# Patient Record
Sex: Female | Born: 1950
Health system: Southern US, Community
[De-identification: ages and names within clinical notes are randomized; demographics above are authoritative.]

## PROBLEM LIST (undated history)

## (undated) DIAGNOSIS — M797 Fibromyalgia: Secondary | ICD-10-CM

## (undated) DIAGNOSIS — E785 Hyperlipidemia, unspecified: Secondary | ICD-10-CM

## (undated) DIAGNOSIS — E049 Nontoxic goiter, unspecified: Secondary | ICD-10-CM

## (undated) DIAGNOSIS — G4733 Obstructive sleep apnea (adult) (pediatric): Secondary | ICD-10-CM

## (undated) DIAGNOSIS — T7840XA Allergy, unspecified, initial encounter: Secondary | ICD-10-CM

## (undated) DIAGNOSIS — Z9989 Dependence on other enabling machines and devices: Secondary | ICD-10-CM

## (undated) DIAGNOSIS — J302 Other seasonal allergic rhinitis: Secondary | ICD-10-CM

## (undated) DIAGNOSIS — K5900 Constipation, unspecified: Secondary | ICD-10-CM

## (undated) DIAGNOSIS — E559 Vitamin D deficiency, unspecified: Secondary | ICD-10-CM

## (undated) HISTORY — DX: Fibromyalgia: M79.7

## (undated) HISTORY — PX: VAGINAL HYSTERECTOMY: SUR661

## (undated) HISTORY — PX: TONSILLECTOMY: SUR1361

## (undated) HISTORY — DX: Hyperlipidemia, unspecified: E78.5

## (undated) HISTORY — PX: CARPAL TUNNEL RELEASE: SHX101

## (undated) HISTORY — PX: TUBAL LIGATION: SHX77

## (undated) HISTORY — DX: Morbid (severe) obesity due to excess calories: E66.01

## (undated) HISTORY — PX: OTHER SURGICAL HISTORY: SHX169

## (undated) HISTORY — PX: CHOLECYSTECTOMY OPEN: SUR202

## (undated) HISTORY — PX: COLONOSCOPY W/ POLYPECTOMY: SHX1380

## (undated) HISTORY — DX: Allergy, unspecified, initial encounter: T78.40XA

## (undated) HISTORY — DX: Vitamin D deficiency, unspecified: E55.9

---

## 1998-06-04 ENCOUNTER — Other Ambulatory Visit: Admission: RE | Admit: 1998-06-04 | Discharge: 1998-06-04 | Payer: Self-pay | Admitting: *Deleted

## 1998-09-12 ENCOUNTER — Ambulatory Visit (HOSPITAL_COMMUNITY): Admission: RE | Admit: 1998-09-12 | Discharge: 1998-09-12 | Payer: Self-pay | Admitting: Internal Medicine

## 2000-06-30 ENCOUNTER — Encounter (INDEPENDENT_AMBULATORY_CARE_PROVIDER_SITE_OTHER): Payer: Self-pay | Admitting: Specialist

## 2000-06-30 ENCOUNTER — Ambulatory Visit (HOSPITAL_COMMUNITY): Admission: RE | Admit: 2000-06-30 | Discharge: 2000-06-30 | Payer: Self-pay | Admitting: Gastroenterology

## 2000-07-06 ENCOUNTER — Encounter: Admission: RE | Admit: 2000-07-06 | Discharge: 2000-07-06 | Payer: Self-pay | Admitting: *Deleted

## 2000-07-15 ENCOUNTER — Encounter: Admission: RE | Admit: 2000-07-15 | Discharge: 2000-07-15 | Payer: Self-pay | Admitting: *Deleted

## 2001-05-31 ENCOUNTER — Encounter: Admission: RE | Admit: 2001-05-31 | Discharge: 2001-05-31 | Payer: Self-pay | Admitting: *Deleted

## 2001-09-20 ENCOUNTER — Encounter: Payer: Self-pay | Admitting: Internal Medicine

## 2001-09-20 ENCOUNTER — Encounter: Admission: RE | Admit: 2001-09-20 | Discharge: 2001-09-20 | Payer: Self-pay | Admitting: Internal Medicine

## 2001-10-26 ENCOUNTER — Other Ambulatory Visit: Admission: RE | Admit: 2001-10-26 | Discharge: 2001-10-26 | Payer: Self-pay | Admitting: *Deleted

## 2001-12-25 ENCOUNTER — Ambulatory Visit (HOSPITAL_COMMUNITY): Admission: RE | Admit: 2001-12-25 | Discharge: 2001-12-25 | Payer: Self-pay | Admitting: Gastroenterology

## 2001-12-25 ENCOUNTER — Encounter: Payer: Self-pay | Admitting: Gastroenterology

## 2001-12-29 ENCOUNTER — Encounter: Payer: Self-pay | Admitting: Gastroenterology

## 2001-12-29 ENCOUNTER — Ambulatory Visit (HOSPITAL_COMMUNITY): Admission: RE | Admit: 2001-12-29 | Discharge: 2001-12-29 | Payer: Self-pay | Admitting: Gastroenterology

## 2002-01-16 ENCOUNTER — Ambulatory Visit (HOSPITAL_BASED_OUTPATIENT_CLINIC_OR_DEPARTMENT_OTHER): Admission: RE | Admit: 2002-01-16 | Discharge: 2002-01-16 | Payer: Self-pay | Admitting: Orthopedic Surgery

## 2002-01-24 ENCOUNTER — Ambulatory Visit (HOSPITAL_COMMUNITY): Admission: RE | Admit: 2002-01-24 | Discharge: 2002-01-24 | Payer: Self-pay | Admitting: Gastroenterology

## 2002-01-24 ENCOUNTER — Encounter (INDEPENDENT_AMBULATORY_CARE_PROVIDER_SITE_OTHER): Payer: Self-pay | Admitting: Specialist

## 2002-02-23 ENCOUNTER — Encounter: Payer: Self-pay | Admitting: Surgery

## 2002-02-23 ENCOUNTER — Encounter (INDEPENDENT_AMBULATORY_CARE_PROVIDER_SITE_OTHER): Payer: Self-pay | Admitting: Specialist

## 2002-02-23 ENCOUNTER — Observation Stay (HOSPITAL_COMMUNITY): Admission: RE | Admit: 2002-02-23 | Discharge: 2002-02-24 | Payer: Self-pay | Admitting: Surgery

## 2002-07-01 ENCOUNTER — Encounter: Payer: Self-pay | Admitting: Emergency Medicine

## 2002-07-01 ENCOUNTER — Emergency Department (HOSPITAL_COMMUNITY): Admission: EM | Admit: 2002-07-01 | Discharge: 2002-07-01 | Payer: Self-pay | Admitting: Emergency Medicine

## 2002-07-05 ENCOUNTER — Inpatient Hospital Stay (HOSPITAL_COMMUNITY): Admission: AD | Admit: 2002-07-05 | Discharge: 2002-07-10 | Payer: Self-pay | Admitting: Internal Medicine

## 2002-07-06 ENCOUNTER — Encounter: Payer: Self-pay | Admitting: *Deleted

## 2002-11-19 ENCOUNTER — Ambulatory Visit (HOSPITAL_COMMUNITY): Admission: RE | Admit: 2002-11-19 | Discharge: 2002-11-19 | Payer: Self-pay | Admitting: Internal Medicine

## 2003-04-11 ENCOUNTER — Encounter: Payer: Self-pay | Admitting: Internal Medicine

## 2003-04-11 ENCOUNTER — Encounter: Admission: RE | Admit: 2003-04-11 | Discharge: 2003-04-11 | Payer: Self-pay | Admitting: Internal Medicine

## 2003-04-17 ENCOUNTER — Encounter: Admission: RE | Admit: 2003-04-17 | Discharge: 2003-04-17 | Payer: Self-pay | Admitting: *Deleted

## 2003-04-25 ENCOUNTER — Encounter: Admission: RE | Admit: 2003-04-25 | Discharge: 2003-05-24 | Payer: Self-pay | Admitting: Internal Medicine

## 2004-11-17 ENCOUNTER — Emergency Department (HOSPITAL_COMMUNITY): Admission: EM | Admit: 2004-11-17 | Discharge: 2004-11-17 | Payer: Self-pay | Admitting: Emergency Medicine

## 2004-12-14 ENCOUNTER — Encounter (INDEPENDENT_AMBULATORY_CARE_PROVIDER_SITE_OTHER): Payer: Self-pay | Admitting: *Deleted

## 2004-12-14 ENCOUNTER — Ambulatory Visit (HOSPITAL_COMMUNITY): Admission: RE | Admit: 2004-12-14 | Discharge: 2004-12-14 | Payer: Self-pay | Admitting: Gastroenterology

## 2005-04-08 ENCOUNTER — Encounter: Admission: RE | Admit: 2005-04-08 | Discharge: 2005-04-08 | Payer: Self-pay | Admitting: Internal Medicine

## 2005-11-19 ENCOUNTER — Encounter: Admission: RE | Admit: 2005-11-19 | Discharge: 2005-11-19 | Payer: Self-pay | Admitting: Internal Medicine

## 2006-03-16 ENCOUNTER — Encounter: Payer: Self-pay | Admitting: Internal Medicine

## 2006-04-21 ENCOUNTER — Ambulatory Visit (HOSPITAL_COMMUNITY): Admission: RE | Admit: 2006-04-21 | Discharge: 2006-04-21 | Payer: Self-pay | Admitting: Internal Medicine

## 2006-04-21 ENCOUNTER — Encounter: Payer: Self-pay | Admitting: Vascular Surgery

## 2007-01-25 ENCOUNTER — Encounter: Admission: RE | Admit: 2007-01-25 | Discharge: 2007-01-25 | Payer: Self-pay | Admitting: Internal Medicine

## 2008-02-28 ENCOUNTER — Encounter: Admission: RE | Admit: 2008-02-28 | Discharge: 2008-02-28 | Payer: Self-pay | Admitting: Internal Medicine

## 2009-03-04 ENCOUNTER — Encounter: Admission: RE | Admit: 2009-03-04 | Discharge: 2009-03-04 | Payer: Self-pay | Admitting: Internal Medicine

## 2009-03-11 ENCOUNTER — Encounter: Admission: RE | Admit: 2009-03-11 | Discharge: 2009-03-11 | Payer: Self-pay | Admitting: Internal Medicine

## 2010-03-16 ENCOUNTER — Encounter: Admission: RE | Admit: 2010-03-16 | Discharge: 2010-03-16 | Payer: Self-pay | Admitting: Internal Medicine

## 2011-04-02 NOTE — Procedures (Signed)
[place]. [facility]  Patient:    Rachel Hess, Rachel Hess                    MRN: [id number] Proc. Date: 06/30/00 Adm. Date:  [id number] Attending:  Charna Seraphim CC:         Lind Guest. August Saucer, M.D.                           Procedure Report  DATE OF BIRTH:  Oct 10, 1951  REFERRING PHYSICIAN:  Lind Guest. August Saucer, M.D.  PROCEDURE PERFORMED:  Colonoscopy with hot biopsies x 3.  ENDOSCOPIST:  Anselmo Rod, M.D.  INSTRUMENT USED:  Olympus video colonoscope.  INDICATIONS FOR PROCEDURE:  Blood in stool in a 60 year old black female. Rule out colonic polyps, masses, hemorrhoids, etc.  The patient also has a long-standing history of constipation.  PREPROCEDURE PREPARATION:  Informed consent was obtained from the patient. The patient was fasted for eight hours prior to the procedure, and prepped with a bottle of magnesium citrate and a gallon of Nulytely the night prior to the procedure.  PREPROCEDURE PHYSICAL EXAMINATION:  VITAL SIGNS:  Stable.  NECK:  Supple.  CHEST:  Clear to auscultation.  S1 and S2 regular.  ABDOMEN:  Soft with normal abdominal bowel sounds.  DESCRIPTION OF PROCEDURE:  The patient was placed in the left lateral decubitus position and sedated with 50 mg of Demerol and 7 mg of Versed intravenously.  Once the patient was adequately sedated and maintained on low flow oxygen and continuous cardiac monitoring, the Olympus video colonoscope was advanced from the rectum to the cecum without difficulty.  Except for left-sided diverticulosis and three diminutive polyps that were removed for hot biopsies from the left colon.  No other masses or polyps were seen.  Small internal hemorrhoids appreciated on retroflexion in the rectum.  The rest of the colon appeared normal up to the cecum.  IMPRESSION: 1. Small non-bleeding internal hemorrhoid. 2. Left-sided diverticulosis. 3. Three small diminutive polyps removed by hot biopsy from left colon. 4.  Normal-appearing transverse colon, right colon, and cecum.  RECOMMENDATIONS: 1. Await pathology results. 2. Increase fluid and fiber in the diet. 3. Outpatient follow up in the next two weeks. DD:  06/30/00 TD:  06/30/00 Job: [id number]

## 2011-04-02 NOTE — Op Note (Signed)
NAME:  Rachel Hess, Rachel Hess           ACCOUNT NO.:  [id number]   MEDICAL RECORD NO.:  [id number]          PATIENT TYPE:  AMB   LOCATION:  ENDO                         FACILITY:  [facility]   PHYSICIAN:  Anselmo Rod, M.D.  DATE OF BIRTH:  May 11, 1951   DATE OF PROCEDURE:  12/14/2004  DATE OF DISCHARGE:                                 OPERATIVE REPORT   PROCEDURE:  Colonoscopy with snare polypectomies x2 and cold biopsies x2.   ENDOSCOPIST:  Anselmo Rod, M.D.   INSTRUMENT USED:  Olympus video colonoscope.   INDICATION FOR PROCEDURE:  A 60 year old African-American female who  underwent screening colonoscopy to rule out colonic polyps, masses, etc.   PREPROCEDURE PREPARATION:  Informed consent was procured from the patient  and the patient fasted for 8 hours prior to the procedure and prepped with a  bottle of magnesium citrate and a gallon of GoLYTELY the night prior to the  procedure.   PREPROCEDURE PHYSICAL:  The patient had stable vital signs, neck supple,  chest clear to auscultation, S1/S2 regular, abdomen soft with normal bowel  sounds.   DESCRIPTION OF PROCEDURE:  The patient was placed in the left lateral  decubitus position and sedated with 100 mg of Demerol and 10 mg of Versed in  slow incremental doses.  Once the patient was adequately sedated and  maintained on low-flow oxygen and continuous cardiac monitoring, the Olympus  video colonoscope was advanced from the rectum to the cecum.  The patient  had some residual stool in the colon and multiple washes were done.  Two  small sessile polyps were biopsied in the rectosigmoid colon and another  sessile polyp was snared from the anal verge.  Another small sessile polyp  was ablated from the rectum with the tip of the snare.  There was evidence  of pandiverticulosis with large diverticula throughout the colon.  The  appendiceal orifice and the ileocecal valve were clearly visualized and  photographed.  The patient  tolerated the procedure well without immediate  complications.  Retroflexion in the rectum revealed small internal  hemorrhoids.   IMPRESSION:  1.  Small, nonbleeding internal hemorrhoids.  2.  Pandiverticulosis with large diverticula throughout the colon.  3.  One large polyp snared from the rectum, another polyp ablated in the      rectum with the tip of the snare.  4.  Two small sessile polyps biopsied from the rectosigmoid colon.  5.  Normal-appearing cecum.   RECOMMENDATIONS:  1.  Await pathology results.  2.  Avoid all nonsteroidals, including aspirin for the next 4 weeks.  3.  Outpatient followup in the next 2 weeks or earlier if need be.      JNM/MEDQ  D:  12/14/2004  T:  12/14/2004  Job:  [id number]   cc:   Minerva Areola L. August Saucer, M.D.  [address] [address]  [place]  [place] [address]  Fax: [phone]

## 2011-04-02 NOTE — Discharge Summary (Signed)
NAME:  Rachel Hess, Rachel Hess                     ACCOUNT NO.:  [id number]   MEDICAL RECORD NO.:  [id number]                   PATIENT TYPE:  INP   LOCATION:  3036                                 FACILITY:  [facility]   PHYSICIAN:  Eric L. August Saucer, M.D.                  DATE OF BIRTH:  07/05/1951   DATE OF ADMISSION:  07/05/2002  DATE OF DISCHARGE:  07/10/2002                                 DISCHARGE SUMMARY   FINAL DIAGNOSES:  1. Unspecified viral infection (079.99).  2. Meningitis secondary to viral infection (322.9).  3. Mastoiditis (383.9).   PROCEDURE:  Spinal tap per Doneta Public, M.D.   HISTORY OF PRESENT ILLNESS:  This is the first recent [facility]  admission for this 60 year old single black female who was recently seen in  the office for evaluation of persistent headache. The headaches first  started approximately eight days prior to presentation. They were dull and  right sided in nature. He has transient nausea without vomiting. She  initially went to the [facility]. She was treated with pain  medication without significant relief. The patient subsequent went to [facility] where a CT scan of the head was done. No lab work was  obtained. She was given Hydrocodone with APAP. The symptoms persisted  thereafter with increasing tenderness along the right mastoid region. She  was seen in the office for evaluation of these symptoms and subsequently  given Zithromax as well as Ultracet for control of the pain. Over the  subsequent days, however, symptoms did not improve. She subsequent presented  to the office with nausea and persistent headaches of unclear etiology. She  was admitted thereafter for further evaluation and control of her symptoms.   PAST MEDICAL HISTORY:  As per admission history and physical.   HOSPITAL COURSE:  The patient was admitted for further evaluation of  persistent right sided headaches. The question of possible viral versus  bacterial meningitis was entertained. Possibility of an atypical migraine  could not be excluded. She was placed at bedrest initially. Started on IV  fluids. She was given Dilaudid with Phenergan parenterally for control of  the pain. Lab data was obtained thereafter to exclude infectious causes. She  was seen in consultation by Dr. Noreene Filbert of Neurology as well. It is felt  that her symptoms are highly suspicious for a meningitis bacterial versus  viral. She subsequently underwent LP under fluoroscopy as she was a  difficult tap. The fluid was initially clear and slightly blood tinged  thereafter. Prior studies were negative for cryptococcal antigen. VDRL was  negative. Sed rate was mildly elevated with CRP elevated at 8.4 as well. An  acute hepatitis panel was negative. The patient was maintained on supportive  care thereafter. Over subsequent days, she gradually made progress. The  question of a possible lupus was entertained as well. ANA was obtained which  was subsequently found to  be negative. The patient's picture gradually  improved steadily. There was a question of a mastoiditis which was treated  with antibiotics as well. After several days of therapy, she felt  significantly better. By July 09, 2002, the patient was ambulatory with no  significant headaches. She did complain of a stuffy cloudy sensation in her  head. No nausea and vomiting. She was subsequently felt to be stable for  discharge. Note that the viral studies were positive for a CMV infection,  though it was not consistent with an acute process.   DISPOSITION:  She was subsequently discharged to home on July 10, 2002  feeling considerably better.   DISCHARGE MEDICATIONS:  1. Zithromax 250 mg QD for an additional three days.  2. Allegra 180 mg QD.  3. Tylox one every four hours as needed pain.   ACTIVITY:  She is to increase her activity as tolerated.   DIET:  She will be maintained on a low sodium no  concentrated sweets diet.   FOLLOW UP:  In the office in two weeks time. She will need a repeat sed rate  and CRP to assure this is approaching back to normal.                                               Eric L. August Saucer, M.D.    ELD/MEDQ  D:  08/29/2002  T:  08/31/2002  Job:  [id number]

## 2011-04-02 NOTE — Procedures (Signed)
[name]. [facility]  Patient:    Rachel Hess, Rachel Hess Visit Number: [id number] MRN: [id number]          Service Type: END Location: ENDO Attending Physician:  Charna Syrita Dictated by:   Anselmo Rod, M.D. Proc. Date: 01/24/02 Admit Date:  01/24/2002   CC:         Minerva Areola L. August Saucer, M.D.  Abigail Miyamoto, M.D.   Procedure Report  DATE OF BIRTH:  [birth year]  REFERRING PHYSICIAN:  Lind Guest. August Saucer, M.D.  PROCEDURE PERFORMED:  Esophagogastroduodenoscopy with biopsies.  ENDOSCOPIST:  Anselmo Rod, M.D.  INSTRUMENT USED:  Olympus video panendoscope.  INDICATIONS FOR PROCEDURE:  Epigastric pain with occasional blood in stool in a 60 year old African-American female.  Patient also has known gallbladder polyps which are to be evaluated by Dr. Magnus Ivan next week.  PREPROCEDURE PREPARATION:  Informed consent was procured from the patient. The patient was fasted for eight hours prior to the procedure.  PREPROCEDURE PHYSICAL:  The patient had stable vital signs.  Neck supple. Chest clear to auscultation.  S1, S2 regular.  Abdomen soft with normal bowel sounds.  DESCRIPTION OF PROCEDURE:  The patient was placed in left lateral decubitus position and sedated with 60 mg of Demerol and 5 mg of Versed intravenously. Once the patient was adequately sedated and maintained on low-flow oxygen and continuous cardiac monitoring, the Olympus video panendoscope was advanced through the mouthpiece, over the tongue, into the esophagus under direct vision.  The entire esophagus appeared normal and without lesions.  The scope was then advanced to the stomach.  There was antral erythema noted.  No frank ulcers, erosions, masses or polyps were seen.  There was moderate duodenitis seen in the duodenal bulb.  Antral biopsies were done to rule out presence of Helicobacter pylori by pathology.  The proximal small bowel distal to the bulb appeared  normal.  IMPRESSION: 1. Normal-appearing esophagus. 2. Antral gastritis. 3. Moderate duodenitis in the duodenal bulb. 4. Normal-appearing small bowel distal to the bulb.  RECOMMENDATION: 1. Continue Nexium. 2. Treat with antibiotics if Helicobacter pylori present on biopsy. 3. Proceed with laparoscopic cholecystectomy as discussed with Dr. Abigail Miyamoto for gallbladder polyps. 4. Outpatient follow-up in the next two weeks. Dictated by:   Anselmo Rod, M.D. Attending Physician:  Charna Alisha DD:  01/24/02 TD:  01/25/02 Job: [id number]

## 2011-04-02 NOTE — Op Note (Signed)
[facility]  Patient:    Rachel Hess, Rachel Hess Visit Number: [id number] MRN: [id number]          Service Type: END Location: ENDO Attending Physician:  Charna Casia Dictated by:   Abigail Miyamoto, M.D. Proc. Date: 02/23/02 Admit Date:  01/24/2002 Discharge Date: 01/24/2002                             Operative Report  PREOPERATIVE DIAGNOSIS:  Gallbladder polyps.  POSTOPERATIVE DIAGNOSIS:  Gallbladder polyps, possible chronic cholecystitis.  OPERATION PERFORMED:  SURGEON:  Abigail Miyamoto, M.D.  ASSISTANT:  Donnie Coffin. Samuella Cota, M.D.  ANESTHESIA:  General endotracheal anesthesia and 0.25% Marcaine plain.  ESTIMATED BLOOD LOSS:  Minimal.  INDICATIONS FOR PROCEDURE:  The patient is a 60 year old female who presents with right upper quadrant pain, nausea and vomiting that is intermittent.  She has had ultrasound, HIDA scan and upper endoscopy.  The ultrasound showed polyps in the gallbladder.  HIDA scan showed normal ejection fraction. Given the patients symptoms and findings of gallbladder polyps, decision has been made to proceed to the operating room for cholecystectomy.  OPERATIVE FINDINGS:  The patient was found to have a chronically scarred appearing gallbladder.  No evidence of stones was identified.  DESCRIPTION OF PROCEDURE:  Patient brought to operating room and identified as [facility] [employer].  She was placed supine on the operating table and general anesthesia was induced.  Her abdomen was then prepped and draped in the usual sterile fashion.  Using a #15 blade a small transverse incision was made below the umbilicus.  The incision was carried down to the fascia which was then opened with a scalpel.  A hemostat was used to pass into the peritoneal cavity.  A 0 Vicryl pursestring suture was then placed around the fascial opening.  The Hasson port was placed through the opening and insufflation of the abdomen was begun.  An 11 mm port was  placed in the patients epigastrium and two 5 mm ports were placed in the patients right flank under direct vision.  The gallbladder was then identified and retracted above the liver bed.  Dissection was then carried out in the base of the gallbladder.  The cystic duct was dissected out after several adhesions were taken down.  The duct was clipped three times proximally, once distally and transected with scissors.  The cystic artery and a branch were identified and clipped proximally and distally and transected as well.  The gallbladder was then slowly dissected free from the liver bed with electrocautery.  Hemostasis was achieved in the liver bed with the cautery.  Once the gallbladder was incised from the liver bed it was removed through the incision at the umbilicus.  The 0 Vicryl at the umbilicus was tied in placed closing the fascial defect. Again, the liver bed was examined and hemostasis appeared to be achieved.  The abdomen was then irrigated with normal saline.  All ports were then removed under direct vision and the abdomen was deflated.  All incisions were then anesthetized with 0.25% Marcaine and then closed with 4-0 Monocryl sutures. Steri-Strips, gauze and tape were then applied.  The patient tolerated the procedure well.  All, sponge, needle and instrument counts were correct at the end of the procedure.  The patient was then extubated in the operating room and taken in stable condition to the recovery room. Dictated by:   Abigail Miyamoto, M.D. Attending Physician:  Charna Paytan DD:  02/23/02 TD:  02/23/02 Job: [id number]

## 2011-04-02 NOTE — Op Note (Signed)
[place]. [facility]  Patient:    Rachel Hess, Rachel Hess Visit Number: [id number] MRN: [id number]          Service Type: DSU Location: [facility] Attending Physician:  Susa Day Dictated by:   Katy Fitch Naaman Plummer., M.D. Proc. Date: 01/16/02 Admit Date:  01/16/2002                             Operative Report  PREOPERATIVE DIAGNOSIS:  Enlarging painful mass, volar aspect of left wrist consistent with flexor carpi radialis sheath ganglion.  POSTOPERATIVE DIAGNOSIS:  Enlarging painful mass, volar aspect of left wrist consistent with flexor carpi radialis sheath ganglion.  Identification of multilobular  ganglion, associated with flexor carpi radialis tendon sheath and the superficial radial artery branch to the palm.  OPERATION PERFORMED:  Excision of a multilobular ganglion from the flexor carpi radialis sheath, left volar wrist.  SURGEON:  Katy Fitch. Sypher, Montez Hageman., M.D.  ASSISTANT:  Jonni Sanger, P.A.  ANESTHESIA:  IV regional.  ANESTHESIOLOGIST:  Dr. Michelle Piper.  INDICATIONS FOR PROCEDURE:  The patient is a 60 year old woman who has had an enlarging mass over the volar aspect of her left wrist.  This is consistent with a ganglion adjacent to the flexor carpi radialis tendon sheath.  She had no antecedent history of injury.  She was experiencing discomfort and requested excision.  DESCRIPTION OF PROCEDURE:  Rachel Hess was brought to the operating room and placed in supine position on the operating table.  Following placement of an IV regional block at the proximal brachial level, the arm was prepped with Betadine soap and solution and sterilely draped.  When anesthesia was satisfactory, the procedure commenced with a short transverse incision in the distal wrist flexion crease.  The subcutaneous tissues were carefully divided taking care to gently dissect the mass circumferentially.  This was noted to be a multilobular ganglion and  appeared to be exiting from the ulnar aspect of the flexor carpi radialis sheath deep to the thenar muscles.  This was circumferentially dissected drained of its contents and carefully probed looking for extensions.  This appeared to extend down to the scaphotrapezial trapezoid joint, deep to the flexor carpi radialis.  This was followed to the joint and joint capsule was curetted with a House microcuret.  The extensions along the superficial branch of the radial artery and accompanying veins was likewise dissected and removed.  Bleeding points were electrocauterized by bipolar current.  I could not identify any clear difficulties along the flexor carpi radialis tendon sheath.  From experience we understand the ganglions in this location are prone to recurrence.  We have advised Ms. Leone preoperatively that we do not have an y way of guaranteeing resolution despite thorough curettage; however, most of the time these recur less than 10% of the time.  The wound was irrigated and repaired with intradermal 3-0 Prolene and Steri-Strips.  Compressive dressing was applied with a volar plaster splint to maintain the wrist in 5 degrees dorsiflexion. Dictated by:   Katy Fitch Naaman Plummer., M.D. Attending Physician:  Susa Day DD:  01/16/02 TD:  01/16/02 Job: [id number]

## 2011-04-02 NOTE — H&P (Signed)
NAME:  Rachel Hess, Rachel Hess                     ACCOUNT NO.:  [id number]   MEDICAL RECORD NO.:  [id number]                   PATIENT TYPE:  INP   LOCATION:  3036                                 FACILITY:  [facility]   PHYSICIAN:  Eric L. August Saucer, M.D.                  DATE OF BIRTH:  Jul 15, 1951   DATE OF ADMISSION:  07/05/2002  DATE OF DISCHARGE:                                HISTORY & PHYSICAL   CHIEF COMPLAINT:  Persistent headaches with progressive weakness.   HISTORY OF PRESENT ILLNESS:  This is the first recent [facility]  admission for this 60 year old single black female who recently was seen in  the office for evaluation of persistent headaches.  Headaches first started  at approximately at age 47.  Headaches were dull and right-sided in nature.  She had transient nausea without vomiting.  The patient initially went to  Urgent Care.  She was treated with pain medication without significant  relief.  She subsequently went to [facility] where a CT scan of her  head was done.  No laboratory work was obtained, however.  The patient was  given hydrocodone with APAP.  Her symptoms persisted, and she subsequently  was seen in the office for evaluation.  She had nonspecific findings at that  time except for mild tenderness in the right mastoid region, as well as some  small posterior cervical nodes.  Sinuses were otherwise clear, ears were  clear.  Question of possible mastoiditis versus sinusitis was entertained.  She was given Zithromax as well as Ultracet for control of pain.  Over the  subsequent days, her symptoms had not improved.  She noted mild nausea as  well for which she received Phenergan suppositories.  Despite this, her  symptoms had not improved, and she was subsequently admitted for further  evaluation.  Notably, she had intermittent low-grade fevers, no night  sweats.  She had not been exposed to anyone especially ill, however, she has  two grandparents in  nursing homes which she visits frequently.   The patient does not smoke or drink.  She had not had similar headaches in  the past.   REVIEW OF SYMPTOMS:  As noted above.  She had noticed some intermittent pain  on the right side of her neck with some mild discomfort from flexion.  Denies significant trouble with concentration.  Appetite had been decreased.   PAST MEDICAL HISTORY:  1. Cholecystectomy in 01/2002, per Dr. Lurene Shadow.  2. Hysterectomy in the past.  3. History of gangrenous cyst removal per Dr. Teressa Senter.   ALLERGIES:  No known drug allergies.   MEDICATIONS:  1. Z-Pack.  2. Ultracet one q.4h. p.r.n. pain.  3. Vivelle patch twice a week.   PHYSICAL EXAMINATION:  GENERAL:  She is a well-developed, well-nourished,  overweight black female, weak appearing.  VITAL SIGNS:  Blood pressure of 109/60, pulse of 76, respiratory rate 18,  temperature 98.2.  Height 5 feet 6 inches, weight 220 pounds.  O2 saturation  96% on room air.  HEENT:  Head is normocephalic.  There is no sinus tenderness.  Fundi disks  were flat.  Nose:  Mild turbinate edema bilaterally without occlusions.  Tympanic membranes are clear.  NECK:  She has tenderness in the right posterior cervical region.  Mild  discomfort on flexion.  No enlarged thyroid.  LUNGS:  Clear without wheezes or rales.  No E to A changes.  CARDIOVASCULAR:  Normal S1 and S2, no S3, S4, murmurs, or rubs.  ABDOMEN:  Bowel sounds are present, no enlargement of spleen, masses, or  tenderness.  EXTREMITIES:  Negative Homan's, no edema.  Full range of motion.  SKIN:  Without active lesions.  No rashes appreciated.  NEUROLOGIC:  Alert and oriented x3, though somewhat sluggish.  Cranial  nerves were intact.  Negative Kernig or Babinski.   LABORATORY DATA:  CBC reveals a white blood cell count of 7800, hemoglobin  of 12.9, hematocrit of 39.2 (this in contrast to a white count on 07/02/02 of  11,300 with a left shift).  Chemistries:  Sodium 141,  potassium 3.8,  chloride 103, CO2 31, BUN 9, creatinine 0.7, glucose 86.  Albumin 2.9.  SGOT  19, SGPT 59, alkaline phosphatase 141.  Other lab studies pending at this  time.  Of note, recent lab work on 04/01/2002, remarkable for a  sedimentation rate of 75.  Elevated liver function tests as well.   IMPRESSION:  1. Persistent right-sided headaches, question etiology.  Rule out viral     versus bacterial meningitis.  Rule out atypical migraine.  2. History of transiently elevated liver function tests, rule out secondary     to recent viral infection.  The patient does not smoke or drink.  3. Low albumin, rule out secondary to recent decreased p.o. intake versus     other.  She has a mild total protein albumin split which would suggest     some inflammatory hemopathy.   PLAN:  Consultation with neurology has been obtained for further evaluation  and possible spinal tap.  We will continue analgesia with Dilaudid 1 mg and  Phenergan 12.5 mg IV.  Further evaluation in response to above.  IV fluids  with rest at this time.                                               Eric L. August Saucer, M.D.    ELD/MEDQ  D:  07/05/2002  T:  07/08/2002  Job:  [id number]

## 2011-04-07 ENCOUNTER — Other Ambulatory Visit: Payer: Self-pay | Admitting: Internal Medicine

## 2011-04-07 DIAGNOSIS — Z1231 Encounter for screening mammogram for malignant neoplasm of breast: Secondary | ICD-10-CM

## 2011-04-30 ENCOUNTER — Ambulatory Visit
Admission: RE | Admit: 2011-04-30 | Discharge: 2011-04-30 | Disposition: A | Discharge status: Home / Self Care | Payer: 59 | Source: Ambulatory Visit | Attending: Internal Medicine | Admitting: Internal Medicine

## 2011-04-30 DIAGNOSIS — Z1231 Encounter for screening mammogram for malignant neoplasm of breast: Secondary | ICD-10-CM

## 2013-02-22 ENCOUNTER — Other Ambulatory Visit: Payer: Self-pay

## 2013-02-22 DIAGNOSIS — Z1231 Encounter for screening mammogram for malignant neoplasm of breast: Secondary | ICD-10-CM

## 2013-03-27 ENCOUNTER — Ambulatory Visit: Payer: 59

## 2013-04-02 ENCOUNTER — Ambulatory Visit
Admission: RE | Admit: 2013-04-02 | Discharge: 2013-04-02 | Disposition: A | Discharge status: Home / Self Care | Payer: 59 | Source: Ambulatory Visit

## 2013-04-02 DIAGNOSIS — Z1231 Encounter for screening mammogram for malignant neoplasm of breast: Secondary | ICD-10-CM

## 2013-10-19 ENCOUNTER — Telehealth: Payer: Self-pay | Admitting: *Deleted

## 2013-10-19 NOTE — Telephone Encounter (Signed)
Pt states the hard core Dr Irving Shows cut out in the summer has grown back. I explained that type of lesion often grew back and with routine maintenance could often be kept comfortable.  I offered pt and appt, but she said she would think about it.

## 2013-12-05 ENCOUNTER — Ambulatory Visit (INDEPENDENT_AMBULATORY_CARE_PROVIDER_SITE_OTHER): Payer: 59 | Admitting: Family Medicine

## 2013-12-05 ENCOUNTER — Encounter: Payer: Self-pay | Admitting: Family Medicine

## 2013-12-05 VITALS — BP 115/79 | HR 88 | Temp 98.4°F | Resp 18 | Ht 65.0 in | Wt 244.0 lb

## 2013-12-05 DIAGNOSIS — N9489 Other specified conditions associated with female genital organs and menstrual cycle: Secondary | ICD-10-CM

## 2013-12-05 DIAGNOSIS — R609 Edema, unspecified: Secondary | ICD-10-CM

## 2013-12-05 DIAGNOSIS — R600 Localized edema: Secondary | ICD-10-CM | POA: Insufficient documentation

## 2013-12-05 DIAGNOSIS — R5381 Other malaise: Secondary | ICD-10-CM

## 2013-12-05 DIAGNOSIS — N898 Other specified noninflammatory disorders of vagina: Secondary | ICD-10-CM | POA: Insufficient documentation

## 2013-12-05 DIAGNOSIS — R635 Abnormal weight gain: Secondary | ICD-10-CM

## 2013-12-05 DIAGNOSIS — R221 Localized swelling, mass and lump, neck: Secondary | ICD-10-CM

## 2013-12-05 DIAGNOSIS — K5909 Other constipation: Secondary | ICD-10-CM | POA: Insufficient documentation

## 2013-12-05 DIAGNOSIS — K5900 Constipation, unspecified: Secondary | ICD-10-CM

## 2013-12-05 DIAGNOSIS — R220 Localized swelling, mass and lump, head: Secondary | ICD-10-CM

## 2013-12-05 DIAGNOSIS — R5383 Other fatigue: Secondary | ICD-10-CM

## 2013-12-05 NOTE — Progress Notes (Signed)
Subjective:    Patient ID: Rachel Hess, female    DOB: 23-Mar-1951, 63 y.o.   MRN: [id number]  Constipation This is a chronic problem. The current episode started in the past 7 days. The problem has been gradually improving since onset. Her stool frequency is 2 to 3 times per week. Pertinent negatives include no back pain or fever.    Patient is in office to establish care with a primary provider. Reports that she was a patient of Dr. Bryon Lions, but was seen primarily by the nurse practitioner. Patient was last seen in October 2014 and had labs drawn at that times.   Patient states that she has ongoing constipation for a number of years and was sent to Dr. Collene Mares in 2013 for a screening colonoscopy. There were no abnormal findings per patient and she was told to return for a repeat colonoscopy in 5 years. Patient was given samples of Linzess, with minimal relief that "stopped working" and currently has 2-3 bowel movements per week.  Reports frequent lower extremity edema. Patient was started on Hydrochlorothiazide 25 mg daily for condition. Patient states that swelling has not improved.  Patient reports that she has gained "a considerable amount of weight over the last few years". States that she has not been motivated to start a diet and exercise regimen due to the fact that she "feels tired all the time".   Review of Systems  Constitutional: Positive for appetite change (increased), fatigue and unexpected weight change (weight gain). Negative for fever.  HENT: Negative.  Negative for congestion, facial swelling and postnasal drip.   Respiratory: Negative for cough, chest tightness and wheezing.   Cardiovascular: Negative for palpitations.  Gastrointestinal: Positive for constipation. Negative for abdominal distention.  Endocrine: Negative for polydipsia, polyphagia and polyuria.  Genitourinary: Negative for urgency, flank pain, vaginal bleeding and vaginal discharge.       Reports  vaginal dryness   Musculoskeletal: Negative for back pain.  Allergic/Immunologic: Negative.   Neurological: Negative.  Negative for dizziness, tremors and headaches.  Hematological: Negative.        Objective:   Physical Exam  Constitutional: She is oriented to person, place, and time. She appears well-developed and well-nourished.  HENT:  Head: Normocephalic.  Eyes: Pupils are equal, round, and reactive to light.  Neck: Normal range of motion. No tracheal tenderness present. Thyromegaly (fullness to neck on inspection and palpation) present. No mass present.  Cardiovascular: Normal rate and regular rhythm.   Pulmonary/Chest: Effort normal and breath sounds normal. No respiratory distress. She has no wheezes. She has no rales. She exhibits no tenderness.  Abdominal: Soft. Bowel sounds are normal. There is tenderness in the left upper quadrant.  Neurological: She is alert and oriented to person, place, and time. No cranial nerve deficit. Coordination abnormal.  Skin: Skin is warm, dry and intact.  Psychiatric: She has a normal mood and affect.       Assessment & Plan:  1. Chronic Constipation: Patient reports taking  Miralax for 2 weeks, with desired effects. Patient states that she had to take more than the recommended dose to achieve the desired effect. Recommend starting a high fiber diet for patient and increase water intake to 4-5 glasses per day. Start walking 3 times per week for 15 minutes. 2. 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 States that she currently has no desire to start a diet or exercise routine despite consistent weight gain.  Patient states that she currently eats 2 meals per day. Recommended that patient divide reduced calorie, high fiber diet over 6 small meals and walk for 15 minutes 3 times per week. Will obtain TSH and HgbA1c in  2 weeks.  3. Fullness to neck: Patient to RTC in 2 weeks for TSH 4.  Vaginal dryness: Patient states that she had an estrogen ring "years ago" post hysterectomy. Patient states that ring was discontinued after developing a "vaginal itch". Patient states that she has occasional vaginal dryness with sexual intercourse. Recommend OTC lubricant with sexual intercourse.  4. Lower extremity edema: Patient to discontinue Hydrochlorothiazide 25 mg daily and Ibuprofen 800 mg as needed. Recommend compression hose and elevate extremities to heart level while at rest. Patient expressed understanding.  5. Colonoscopy: In 2013, patient had a colonoscopy with Dr. Collene Mares that was within normal limits per patient. gave patient 5 years before next colonoscopy. Will review notes as they become available. 6.  Last Mammogram May, 19 2014. Reviewed results from [facility].  Will repeat mammogram in May 2015 7. Complete Hysterectomy at age 89. Last pap smear at age 18 was normal. Will perform pelvic examination at preventative visit in 1 month. 8. Hx of fibromyalgia per patient. Is currently not taking any medications for condition. States that she was going to a fibromyalgia specialist, however, stopped going for personal reasons. Patient cannot remember name and location of specialist.  9. Vaccinations: Patient states that she "does not take the flu shot". TDap was in 2012.  10. Exercise-Currently not exercising. Recommend 15 minutes low impact aerobic activity 3 times per week. 11. Last complete physical examination: Patient states that it could have been 1 year ago, but she does not recall. Schedule complete physical examination in 1 month  RTC: 1 month with Dr. Zigmund Daniel for preventative visit Labs: TSH (2 weeks)

## 2013-12-05 NOTE — Patient Instructions (Signed)
Stop taking Ibuprofen 800 mg as needed for pain. Recommend OTC Tylenol 650 mg every 6 hours as needed for mild to moderate pain.  Recommend Compression stockings daily for lower extremity edema. Elevate lower extremities to heart level while at rest Patient to return in 2 weeks for labs.

## 2013-12-19 ENCOUNTER — Other Ambulatory Visit: Payer: 59

## 2013-12-19 DIAGNOSIS — R635 Abnormal weight gain: Secondary | ICD-10-CM

## 2013-12-19 DIAGNOSIS — R600 Localized edema: Secondary | ICD-10-CM

## 2013-12-19 DIAGNOSIS — R5381 Other malaise: Secondary | ICD-10-CM

## 2013-12-19 DIAGNOSIS — R5383 Other fatigue: Secondary | ICD-10-CM

## 2013-12-19 LAB — CBC WITH DIFFERENTIAL/PLATELET
BASOS PCT: 1 % (ref 0–1)
Basophils Absolute: 0.1 10*3/uL (ref 0.0–0.1)
EOS ABS: 0.1 10*3/uL (ref 0.0–0.7)
EOS PCT: 2 % (ref 0–5)
HEMATOCRIT: 40.3 % (ref 36.0–46.0)
HEMOGLOBIN: 13.9 g/dL (ref 12.0–15.0)
LYMPHS PCT: 36 % (ref 12–46)
Lymphs Abs: 2.8 10*3/uL (ref 0.7–4.0)
MCH: 30 pg (ref 26.0–34.0)
MCHC: 34.5 g/dL (ref 30.0–36.0)
MCV: 87 fL (ref 78.0–100.0)
Monocytes Absolute: 0.5 10*3/uL (ref 0.1–1.0)
Monocytes Relative: 7 % (ref 3–12)
Neutro Abs: 4.4 10*3/uL (ref 1.7–7.7)
Neutrophils Relative %: 54 % (ref 43–77)
PLATELETS: 302 10*3/uL (ref 150–400)
RBC: 4.63 MIL/uL (ref 3.87–5.11)
RDW: 14.4 % (ref 11.5–15.5)
WBC: 7.8 10*3/uL (ref 4.0–10.5)

## 2013-12-19 LAB — COMPLETE METABOLIC PANEL WITH GFR
ALBUMIN: 3.8 g/dL (ref 3.5–5.2)
ALK PHOS: 85 U/L (ref 39–117)
ALT: 11 U/L (ref 0–35)
AST: 11 U/L (ref 0–37)
BILIRUBIN TOTAL: 0.4 mg/dL (ref 0.2–1.2)
BUN: 15 mg/dL (ref 6–23)
CALCIUM: 9.8 mg/dL (ref 8.4–10.5)
CHLORIDE: 103 meq/L (ref 96–112)
CO2: 27 meq/L (ref 19–32)
CREATININE: 0.76 mg/dL (ref 0.50–1.10)
GFR, EST NON AFRICAN AMERICAN: 83 mL/min
GFR, Est African American: >89 mL/min
Glucose, Bld: 111 mg/dL — ABNORMAL HIGH (ref 70–99)
POTASSIUM: 4.2 meq/L (ref 3.5–5.3)
SODIUM: 139 meq/L (ref 135–145)
Total Protein: 6.7 g/dL (ref 6.0–8.3)

## 2013-12-19 LAB — HEMOGLOBIN A1C
HEMOGLOBIN A1C: 6.6 % — AB (ref <5.7)
Mean Plasma Glucose: 143 mg/dL — ABNORMAL HIGH (ref <117)

## 2014-01-08 ENCOUNTER — Ambulatory Visit: Payer: 59 | Admitting: Family Medicine

## 2014-01-14 ENCOUNTER — Encounter: Payer: Self-pay | Admitting: Family Medicine

## 2014-01-14 ENCOUNTER — Ambulatory Visit (INDEPENDENT_AMBULATORY_CARE_PROVIDER_SITE_OTHER): Payer: 59 | Admitting: Family Medicine

## 2014-01-14 VITALS — BP 145/79 | HR 89 | Temp 97.0°F | Resp 20 | Ht 66.0 in | Wt 245.0 lb

## 2014-01-14 DIAGNOSIS — R635 Abnormal weight gain: Secondary | ICD-10-CM

## 2014-01-14 DIAGNOSIS — R7309 Other abnormal glucose: Secondary | ICD-10-CM

## 2014-01-14 DIAGNOSIS — R609 Edema, unspecified: Secondary | ICD-10-CM

## 2014-01-14 DIAGNOSIS — G4730 Sleep apnea, unspecified: Secondary | ICD-10-CM

## 2014-01-14 DIAGNOSIS — R7303 Prediabetes: Secondary | ICD-10-CM

## 2014-01-14 DIAGNOSIS — N898 Other specified noninflammatory disorders of vagina: Secondary | ICD-10-CM

## 2014-01-14 DIAGNOSIS — N9489 Other specified conditions associated with female genital organs and menstrual cycle: Secondary | ICD-10-CM

## 2014-01-14 NOTE — Progress Notes (Signed)
   Subjective:    Patient ID: Rachel Hess, female    DOB: December 18, 1950, 63 y.o.   MRN: [id number]  HPI Patient in office for 2 month follow up to discuss previous laboratory results. Reports that she continues to have bilateral foot swelling, greater on the left side. Patient discontinued hydrochlorothiazide ordered by previous provider in January for edema. Reports that edema was the same regardless to whether she was on medication. Maintains that she has not been elevating extremity consistently.    Patient reports that she has experienced increased snoring and wakes up frequently throughout the night. Patient has daytime sleepiness. Patient's family has been complaining of increase snoring and bouts of apnea. Patient denies a history of sleep apnea. Patient get between 5-6 hours of sleep per night.  Patient states that she has not been dieting or exercising due to recent cold weather.   Review of Systems  Eyes: Negative.   Respiratory: Positive for apnea.   Cardiovascular: Positive for leg swelling.  Gastrointestinal: Negative.   Endocrine: Negative.   Genitourinary: Negative.   Musculoskeletal: Positive for joint swelling.  Skin: Negative.   Allergic/Immunologic: Negative.   Hematological: Negative.   Psychiatric/Behavioral: Negative.        Objective:   Physical Exam  Nursing note and vitals reviewed. Constitutional: She is oriented to person, place, and time. She appears well-developed and well-nourished. She is active.  HENT:  Head: Normocephalic and atraumatic.  Eyes: Conjunctivae and EOM are normal. Pupils are equal, round, and reactive to light.  Neck: Normal range of motion and full passive range of motion without pain.  Cardiovascular: Normal rate, regular rhythm and normal heart sounds.   Pulmonary/Chest: Effort normal and breath sounds normal.  Abdominal: Soft. Bowel sounds are normal.  Musculoskeletal: Normal range of motion.  Neurological: She is alert and  oriented to person, place, and time. She has normal reflexes.  Skin: Skin is warm and dry.          Assessment & Plan:  1. Lower extremity edema: BP recheck 130/75. Patient denies history of hypertension. Patient was prescribed Hydrochlorothiazide 25 mg by previous provider for lower extremity edema, which she discontinued in January. Swelling primarily occurs in left extremity.  EKG done in office (normal sinus rhythm on review). Discussed results with patient at length, expressed understanding.  Echocardiogram ordered, will schedule notify patient with appointment time. Recommend that patient elevate lower extremities to heart level while at rest and wear compression stockings during the day.  2. Pre diabetes: 1800 calorie diet divided over 6 small meals. Increase water intake to 3-4 glasses.  Given written information on diet and exercise plan. Will recheck hemoglobin  A1C in 3 months 3. Weight gain: Lose 5-10 % body weight, which can lower risk of developing diabetes. Choose a diet rich in fruits, vegetables and low fat dairy products, but low in meats, sweets, and refined grains. Refrain from sweet drinks, soda, and juice. Be active for 30 minutes per day. Exercise burns calories, helps to control blood sugar levels, and lowers overall stress levels. Stretch prior to exercise to aid muscles and joints move fluidly.  4. Vaginal dryness: Recommend that patient utilize OTC Replens pads 3 times weekly. Patient states that she was on an estrogen ring some years ago, which worked for post menopausal symptoms.  5. Observed sleep apnea: Will order sleep study   Follow up in 3 months for CPE with Dr. Zigmund Daniel

## 2014-01-16 DIAGNOSIS — G4730 Sleep apnea, unspecified: Secondary | ICD-10-CM | POA: Insufficient documentation

## 2014-01-16 DIAGNOSIS — R609 Edema, unspecified: Secondary | ICD-10-CM | POA: Insufficient documentation

## 2014-01-16 DIAGNOSIS — R7303 Prediabetes: Secondary | ICD-10-CM | POA: Insufficient documentation

## 2014-01-16 NOTE — Patient Instructions (Addendum)
1.. Lower extremity edema:   EKG done in office Echocardiogram ordered, will schedule notify patient with appointment time. Recommend that patient elevate lower extremities to heart level while at rest and wear compression stockings during the day.  2. Pre diabetes: 1800 calorie diet divided over 6 small meals. Increase water intake to 3-4 glasses.  Given written information on diet and exercise plan. Will recheck hemoglobin  A1C in 3 months 3. Weight gain: Lose 5-10 % body weight, which can lower risk of developing diabetes. Choose a diet rich in fruits, vegetables and low fat dairy products, but low in meats, sweets, and refined grains. Refrain from sweet drinks, soda, and juice. Be active for 30 minutes per day. Exercise burns calories, helps to control blood sugar levels, and lowers overall stress levels. Stretch prior to exercise to aid muscles and joints move fluidly.  4. Vaginal dryness: Recommend that patient utilize OTC Replens pads 3 times weekly. 5. Observed sleep apnea: Will order sleep study   Follow up in 3 months for CPE with Dr. Zigmund Daniel

## 2014-02-27 ENCOUNTER — Ambulatory Visit (HOSPITAL_BASED_OUTPATIENT_CLINIC_OR_DEPARTMENT_OTHER): Payer: 59 | Attending: Family Medicine | Admitting: Radiology

## 2014-02-27 VITALS — Ht 66.0 in | Wt 240.0 lb

## 2014-02-27 DIAGNOSIS — G4730 Sleep apnea, unspecified: Secondary | ICD-10-CM

## 2014-02-27 DIAGNOSIS — G4710 Hypersomnia, unspecified: Secondary | ICD-10-CM | POA: Insufficient documentation

## 2014-03-01 ENCOUNTER — Other Ambulatory Visit: Payer: Self-pay

## 2014-03-01 DIAGNOSIS — Z1231 Encounter for screening mammogram for malignant neoplasm of breast: Secondary | ICD-10-CM

## 2014-03-03 DIAGNOSIS — G4710 Hypersomnia, unspecified: Secondary | ICD-10-CM

## 2014-03-03 DIAGNOSIS — G4730 Sleep apnea, unspecified: Secondary | ICD-10-CM

## 2014-03-03 NOTE — Sleep Study (Signed)
   NAME: Rachel Hess DATE OF BIRTH:  12/02/1950 MEDICAL RECORD NUMBER [id number]  LOCATION: [facility]  PHYSICIAN: Jamas Jaquay D Kenneth Cuaresma  DATE OF STUDY: 02/27/2014  SLEEP STUDY TYPE: Nocturnal Polysomnogram               REFERRING PHYSICIAN: Dorena Dew, FNP  INDICATION FOR STUDY: hypersomnia with sleep apnea  EPWORTH SLEEPINESS SCORE:   7/24 HEIGHT: 5\' 6"  (167.6 cm)  WEIGHT: 240 lb (108.863 kg)    Body mass index is 38.76 kg/(m^2).  NECK SIZE: 15 in.  MEDICATIONS: Charted for review  SLEEP ARCHITECTURE: Total sleep time 384.5 minutes with sleep efficiency 87.6%. Stage I was 6.9%, stage II 64.4%, stage III absent, REM 28.7% of total sleep time. Sleep latency 12.5 minutes, REM latency 66.5 minutes, awake after sleep onset 42 minutes, arousal index 17.3. Bedtime medication: None  RESPIRATORY DATA: Apnea hypopnea index (AHI) 16.4 per hour. 105 total events scored including 19 obstructive apneas, 9 central apneas, 77 hypopneas. Events were not positional. REM AHI 42.9 per hour. This was ordered as a diagnostic polysomnogram and CPAP titration was not done.  OXYGEN DATA: Moderate to loud snoring with oxygen desaturation to a nadir of 85% and mean oxygen saturation through the study of 93.2% on room air.  CARDIAC DATA: Sinus rhythm with PACs  MOVEMENT/PARASOMNIA: No significant movement disturbance, no bathroom trips  IMPRESSION/ RECOMMENDATION:   1) Moderate obstructive sleep apnea/hypopneas syndrome, AHI 16.4 per hour with non-positional events. REM AHI 42.9 per hour. Moderate to loud snoring with oxygen desaturation to a nadir of 85% and mean oxygen saturation through the study of 93.2% on room air. 2) This study was ordered as a diagnostic polysomnogram without CPAP. The patient can return for a dedicated CPAP titration study if desired.   Aldona Bar M.D. [place], [facility]  ELECTRONICALLY SIGNED ON:   03/03/2014, 1:28 PM [place] PH: [phone]   FX: [phone] [place]

## 2014-03-06 ENCOUNTER — Telehealth: Payer: Self-pay

## 2014-03-07 NOTE — Telephone Encounter (Signed)
Please have patient schedule a follow up appointment to discuss sleep study results.

## 2014-03-11 ENCOUNTER — Telehealth: Payer: Self-pay

## 2014-03-11 NOTE — Telephone Encounter (Signed)
Pt was contacted and appointment made to discuss stomach issues that she has been having since the weekend.Also Pt was alerted that NP will want to discuss her most recent sleep study results.

## 2014-03-12 ENCOUNTER — Ambulatory Visit (INDEPENDENT_AMBULATORY_CARE_PROVIDER_SITE_OTHER): Payer: 59 | Admitting: Family Medicine

## 2014-03-12 ENCOUNTER — Encounter: Payer: Self-pay | Admitting: Family Medicine

## 2014-03-12 ENCOUNTER — Telehealth: Payer: Self-pay

## 2014-03-12 ENCOUNTER — Other Ambulatory Visit: Payer: Self-pay | Admitting: Family Medicine

## 2014-03-12 VITALS — BP 107/44 | HR 79 | Temp 98.1°F | Resp 20 | Wt 234.0 lb

## 2014-03-12 DIAGNOSIS — E860 Dehydration: Secondary | ICD-10-CM

## 2014-03-12 DIAGNOSIS — R197 Diarrhea, unspecified: Secondary | ICD-10-CM

## 2014-03-12 DIAGNOSIS — R109 Unspecified abdominal pain: Secondary | ICD-10-CM

## 2014-03-12 DIAGNOSIS — R110 Nausea: Secondary | ICD-10-CM

## 2014-03-12 LAB — COMPREHENSIVE METABOLIC PANEL
ALT: 24 U/L (ref 0–35)
AST: 21 U/L (ref 0–37)
Albumin: 4 g/dL (ref 3.5–5.2)
Alkaline Phosphatase: 91 U/L (ref 39–117)
BUN: 15 mg/dL (ref 6–23)
CO2: 23 meq/L (ref 19–32)
Calcium: 9 mg/dL (ref 8.4–10.5)
Chloride: 106 mEq/L (ref 96–112)
Creat: 0.71 mg/dL (ref 0.50–1.10)
Glucose, Bld: 78 mg/dL (ref 70–99)
Potassium: 4.1 mEq/L (ref 3.5–5.3)
Sodium: 139 mEq/L (ref 135–145)
Total Bilirubin: 0.3 mg/dL (ref 0.2–1.2)
Total Protein: 6.9 g/dL (ref 6.0–8.3)

## 2014-03-12 MED ORDER — ONDANSETRON HCL 4 MG PO TABS: 4.0000 mg | ORAL_TABLET | Freq: Four times a day (QID) | ORAL | Status: DC | PRN

## 2014-03-12 NOTE — Progress Notes (Signed)
   Subjective:    Patient ID: Rachel Hess, female    DOB: Feb 23, 1951, 63 y.o.   MRN: [id number]  HPI Patient complaining of nausea and diarrhea for 4 days. 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 Patient maintains that abdominal discomfort started on 03/08/2014  and was proceeded by nausea and diarrhea. Patient is currently having 3-4 stools per day described at watery. Patient attempted to eat a hamburger on 03/11/2014, which was followed by nausea. The patient denies headache, dizziness, shortness of breath, vomiting, and bloody stools.   Review of Systems  Constitutional: Negative.   HENT: Negative.   Eyes: Negative.   Respiratory: Negative.   Cardiovascular: Negative.  Negative for leg swelling.  Gastrointestinal: Positive for nausea, abdominal pain and diarrhea.  Endocrine: Negative.   Genitourinary: Negative.  Negative for urgency, difficulty urinating and pelvic pain.  Musculoskeletal: Negative.   Skin: Negative.   Allergic/Immunologic: Negative.   Neurological: Negative.   Hematological: Negative.   Psychiatric/Behavioral: Negative.        Objective:   Physical Exam  Constitutional: She is oriented to person, place, and time. She appears well-developed and well-nourished.  HENT:  Head: Normocephalic and atraumatic.  Eyes: Pupils are equal, round, and reactive to light.  Neck: Normal range of motion. Neck supple.  Abdominal: Soft. Normal appearance. She exhibits no distension. There is tenderness in the left upper quadrant.  Musculoskeletal: Normal range of motion.  Neurological: She is alert and oriented to person, place, and time.  Skin: Skin is warm, dry and intact.  Psychiatric: She has a normal mood and affect. Her speech is normal and behavior is normal. Judgment and thought content normal. Cognition and memory are normal.          Assessment & Plan:  1. Gastroenteritis-Diarrhea: Patient to start a  clear liquid diet for the next 12 hours, including water, broth, jello, pop-cycles, italian ice. If she tolerates clear liquids, transition to full liquids (cream soup, grits, yogurt) for 12 hours. Gradually introduce regular foods into diet. Check complete metabolic panel, CBC,  and urinalysis.   2. Nausea: Start Zofran 4 mg tablet prior to meals every 6 hours  as needed for nausea.   3. Mild dehydration: Recommend that patient increase fluid intake to 64 ounces daily to replenish fluid loss from diarrhea. Patient can add fluids such as sport drinks to restore electrolyte balance. Check CMP and urinalysis   RTC: 2 weeks with Dr. Zigmund Daniel to discuss sleep study results further Labs: CBC, CMP, and Urinalysis

## 2014-03-12 NOTE — Telephone Encounter (Signed)
Pt was contacted at home # as well as cell # VM was left to plz call office concerning today's Appointment @2pm 

## 2014-03-13 ENCOUNTER — Telehealth: Payer: Self-pay | Admitting: Family Medicine

## 2014-03-13 LAB — URINALYSIS, MICROSCOPIC ONLY
BACTERIA UA: NONE SEEN
CRYSTALS: NONE SEEN
Casts: NONE SEEN

## 2014-03-13 LAB — URINALYSIS, ROUTINE W REFLEX MICROSCOPIC
Bilirubin Urine: NEGATIVE
GLUCOSE, UA: NEGATIVE mg/dL
HGB URINE DIPSTICK: NEGATIVE
KETONES UR: NEGATIVE mg/dL
LEUKOCYTES UA: NEGATIVE
NITRITE: NEGATIVE
PH: 6 (ref 5.0–8.0)
PROTEIN: NEGATIVE mg/dL
Specific Gravity, Urine: 1.029 (ref 1.005–1.030)
Urobilinogen, UA: 1 mg/dL (ref 0.0–1.0)

## 2014-03-13 NOTE — Telephone Encounter (Signed)
Notified patient to inquire about current condition and discuss laboratory results. Also, patient is scheduled with Dr. Zigmund Daniel on 03/25/2014.

## 2014-03-28 ENCOUNTER — Ambulatory Visit: Payer: 59 | Admitting: Internal Medicine

## 2014-03-28 ENCOUNTER — Encounter: Payer: Self-pay | Admitting: Internal Medicine

## 2014-03-28 NOTE — Progress Notes (Unsigned)
   Subjective:    Patient ID: Rachel Hess, female    DOB: Apr 28, 1951, 63 y.o.   MRN: [id number]  HPI:     Review of Systems     Objective:   Physical Exam        Assessment & Plan:  1. Fibromyalgia: Stretching  2. LLE swelling: Compression hose  3. Obstructive Sleep Apnea: Pt did not have titration performed. Will speak with Dr. Annamaria Boots about   4. Neck fullness and symptoms of constipation and weigh gain: Will obtain TSH and Free T4.  5. Constipation:   6.  Annual Visit: Will check Lipid, TSH,  7.  Mammogram: Scheduled next week.

## 2014-04-03 ENCOUNTER — Inpatient Hospital Stay: Admission: RE | Admit: 2014-04-03 | Payer: 59 | Source: Ambulatory Visit

## 2014-04-04 ENCOUNTER — Ambulatory Visit
Admission: RE | Admit: 2014-04-04 | Discharge: 2014-04-04 | Disposition: A | Discharge status: Home / Self Care | Payer: 59 | Source: Ambulatory Visit

## 2014-04-04 DIAGNOSIS — Z1231 Encounter for screening mammogram for malignant neoplasm of breast: Secondary | ICD-10-CM

## 2014-05-08 ENCOUNTER — Telehealth: Payer: Self-pay

## 2014-05-08 NOTE — Telephone Encounter (Signed)
Pt contacted office stating she would like  info on the sleep study that was performed on her back in April of this yr. Pt also wanted to know as to why the full sleep study was not performed.I stated to Pt it sounded as if a split night could have been performed on her.Plz Advise.

## 2014-05-08 NOTE — Telephone Encounter (Signed)
Contacted patient on 05/08/2014. Patient will need a CPAP evaluation for obstructive sleep apnea scheduled  at [facility]. Will have to obtain prior approval via patient's insurance company.

## 2014-05-10 ENCOUNTER — Telehealth: Payer: Self-pay

## 2014-05-10 NOTE — Telephone Encounter (Signed)
Pt's Insurance was contacted today for Prior Auth. for Pt's [facility] will be faxing over Confirmation on Approval as soon as it had a chance to review.AXK#[id number]

## 2014-05-13 ENCOUNTER — Telehealth: Payer: Self-pay

## 2014-05-13 NOTE — Telephone Encounter (Signed)
Pt was contacted and VM was left advising her of new appointment w/ sleep lab on 06/26/2013@8  pm.(CPAP Eval due to OSA) Pt's Prior Auth has been faxed over to sleep office as well.

## 2014-05-16 ENCOUNTER — Telehealth: Payer: Self-pay

## 2014-05-16 DIAGNOSIS — G4730 Sleep apnea, unspecified: Secondary | ICD-10-CM

## 2014-05-16 NOTE — Telephone Encounter (Signed)
Spoke w/ sleep lab.CPT Code to use for CPAP Titration will be 958.[place]

## 2014-05-16 NOTE — Telephone Encounter (Signed)
Ordered CPAP Titration

## 2014-06-25 ENCOUNTER — Telehealth: Payer: Self-pay | Admitting: Internal Medicine

## 2014-06-25 NOTE — Telephone Encounter (Signed)
Patient called regarding coding for sleep study. Patient was told she was only scheduled for 1/2 of study and would like coding to be corrected so she is able to complete entire study .

## 2014-06-26 ENCOUNTER — Encounter (HOSPITAL_BASED_OUTPATIENT_CLINIC_OR_DEPARTMENT_OTHER): Payer: 59

## 2015-02-27 ENCOUNTER — Other Ambulatory Visit: Payer: Self-pay | Admitting: Nurse Practitioner

## 2015-02-27 ENCOUNTER — Ambulatory Visit
Admission: RE | Admit: 2015-02-27 | Discharge: 2015-02-27 | Disposition: A | Discharge status: Home / Self Care | Payer: Medicare Other | Source: Ambulatory Visit | Attending: Nurse Practitioner | Admitting: Nurse Practitioner

## 2015-02-27 DIAGNOSIS — R7309 Other abnormal glucose: Secondary | ICD-10-CM | POA: Diagnosis not present

## 2015-02-27 DIAGNOSIS — J189 Pneumonia, unspecified organism: Secondary | ICD-10-CM

## 2015-02-27 DIAGNOSIS — E049 Nontoxic goiter, unspecified: Secondary | ICD-10-CM | POA: Diagnosis not present

## 2015-02-27 DIAGNOSIS — E559 Vitamin D deficiency, unspecified: Secondary | ICD-10-CM | POA: Diagnosis not present

## 2015-03-04 ENCOUNTER — Other Ambulatory Visit: Payer: Self-pay

## 2015-03-04 DIAGNOSIS — Z1231 Encounter for screening mammogram for malignant neoplasm of breast: Secondary | ICD-10-CM

## 2015-03-18 ENCOUNTER — Other Ambulatory Visit: Payer: Self-pay | Admitting: Nurse Practitioner

## 2015-03-18 DIAGNOSIS — E049 Nontoxic goiter, unspecified: Secondary | ICD-10-CM

## 2015-03-26 ENCOUNTER — Ambulatory Visit
Admission: RE | Admit: 2015-03-26 | Discharge: 2015-03-26 | Disposition: A | Discharge status: Home / Self Care | Payer: Medicare Other | Source: Ambulatory Visit | Attending: Nurse Practitioner | Admitting: Nurse Practitioner

## 2015-03-26 DIAGNOSIS — E049 Nontoxic goiter, unspecified: Secondary | ICD-10-CM

## 2015-04-02 DIAGNOSIS — Z0000 Encounter for general adult medical examination without abnormal findings: Secondary | ICD-10-CM | POA: Diagnosis not present

## 2015-04-02 DIAGNOSIS — Z1389 Encounter for screening for other disorder: Secondary | ICD-10-CM | POA: Diagnosis not present

## 2015-04-07 ENCOUNTER — Ambulatory Visit
Admission: RE | Admit: 2015-04-07 | Discharge: 2015-04-07 | Disposition: A | Discharge status: Home / Self Care | Payer: Medicare Other | Source: Ambulatory Visit

## 2015-04-07 DIAGNOSIS — Z1231 Encounter for screening mammogram for malignant neoplasm of breast: Secondary | ICD-10-CM

## 2015-05-12 ENCOUNTER — Other Ambulatory Visit: Payer: Self-pay

## 2015-06-02 IMAGING — MG MM SCREENING BREAST TOMO BILATERAL
6 of 9 series · 6 of 25 positions shown · non-contrast
Comparison: Previous exam(s).

CLINICAL DATA: Screening.

EXAM:
DIGITAL SCREENING BILATERAL MAMMOGRAM WITH 3D TOMO WITH CAD

[R CV]
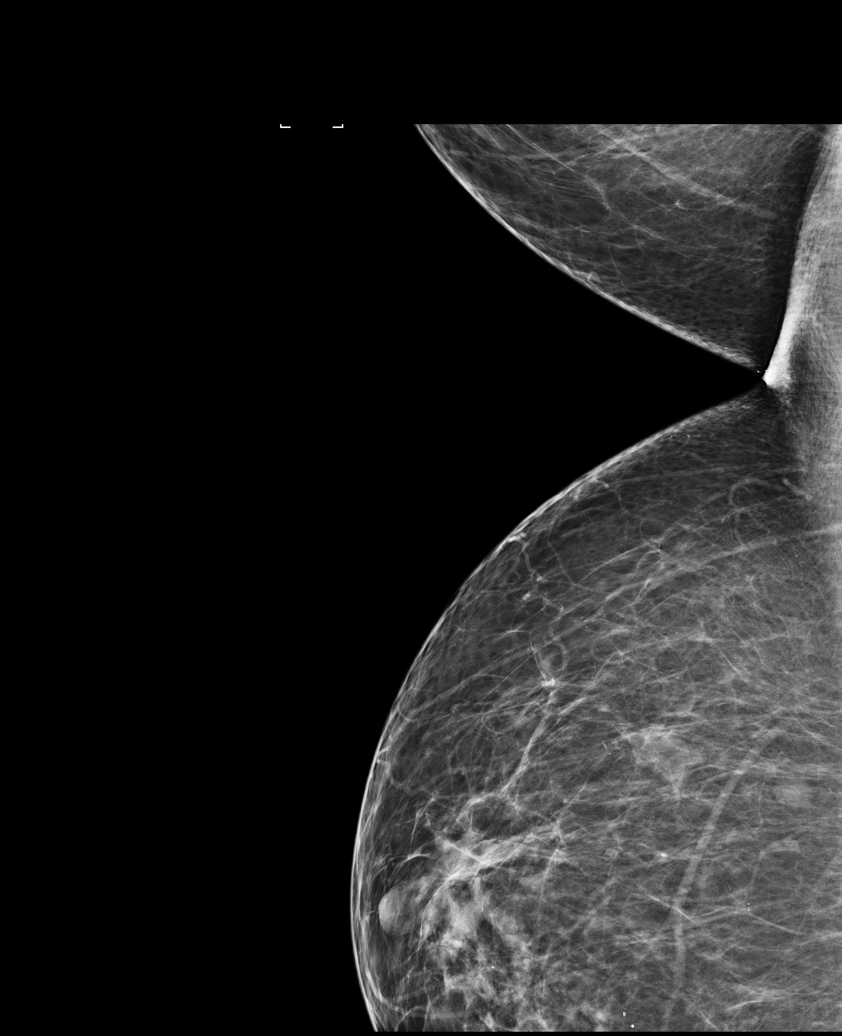

[R MLO]
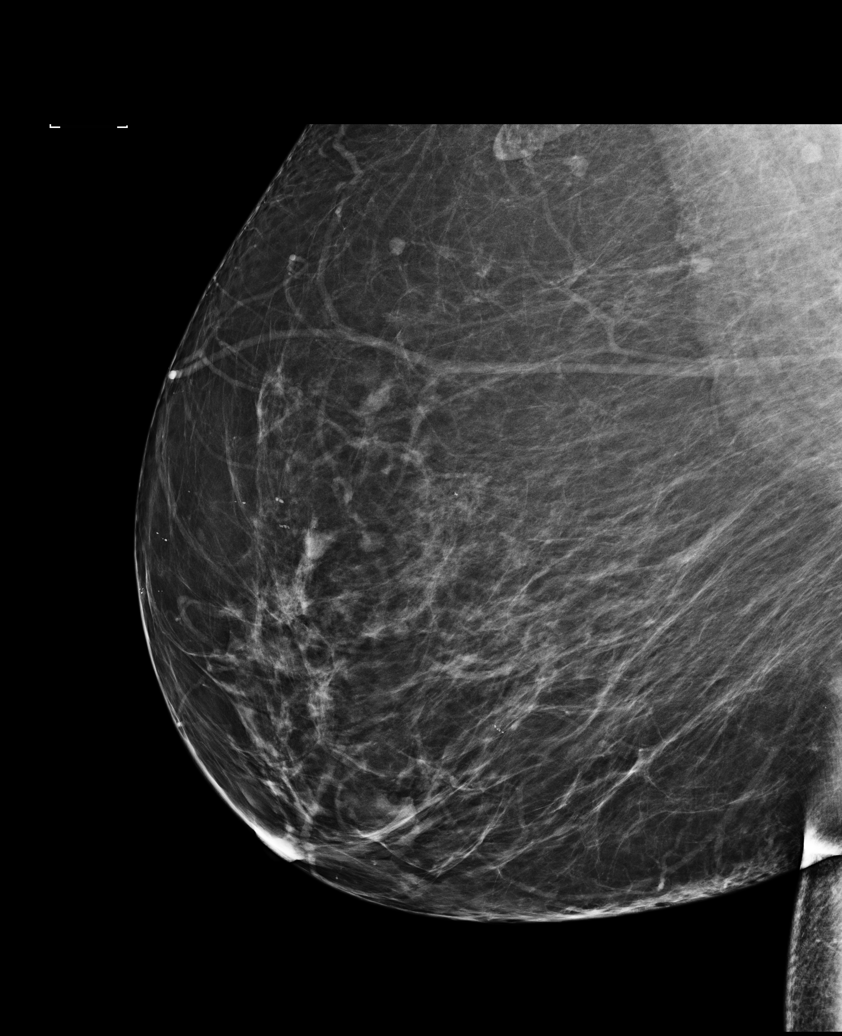

[L MLO]
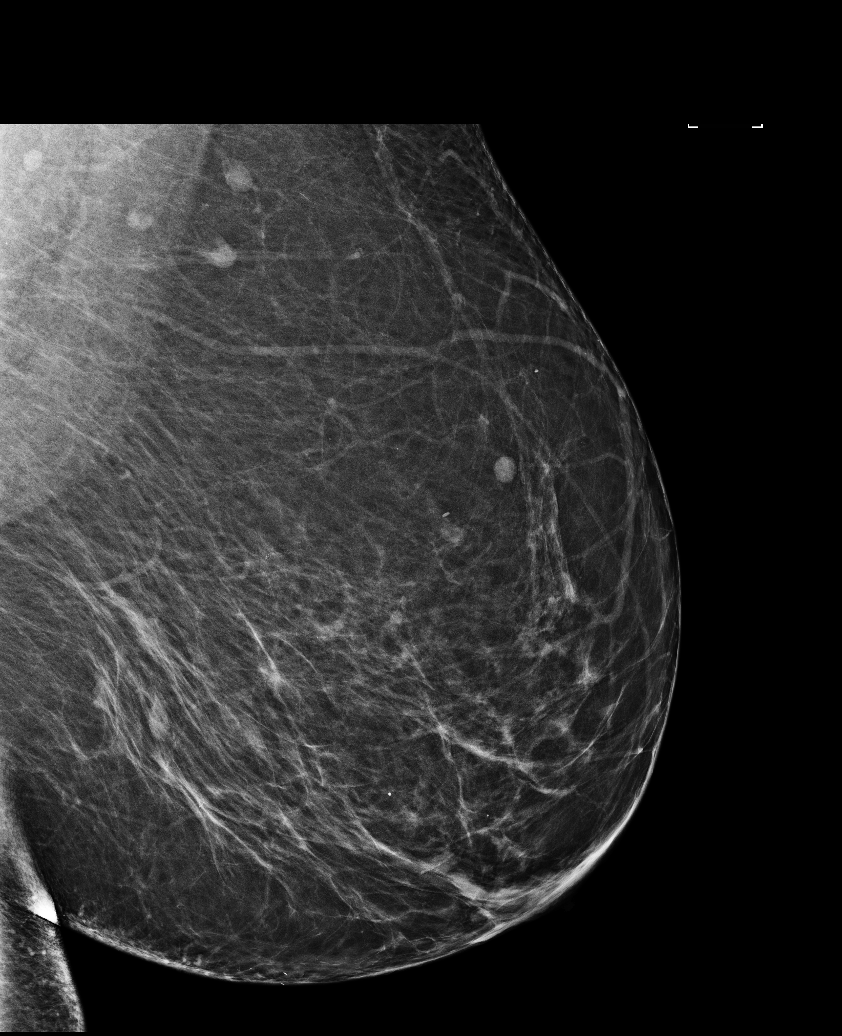

[L CC]
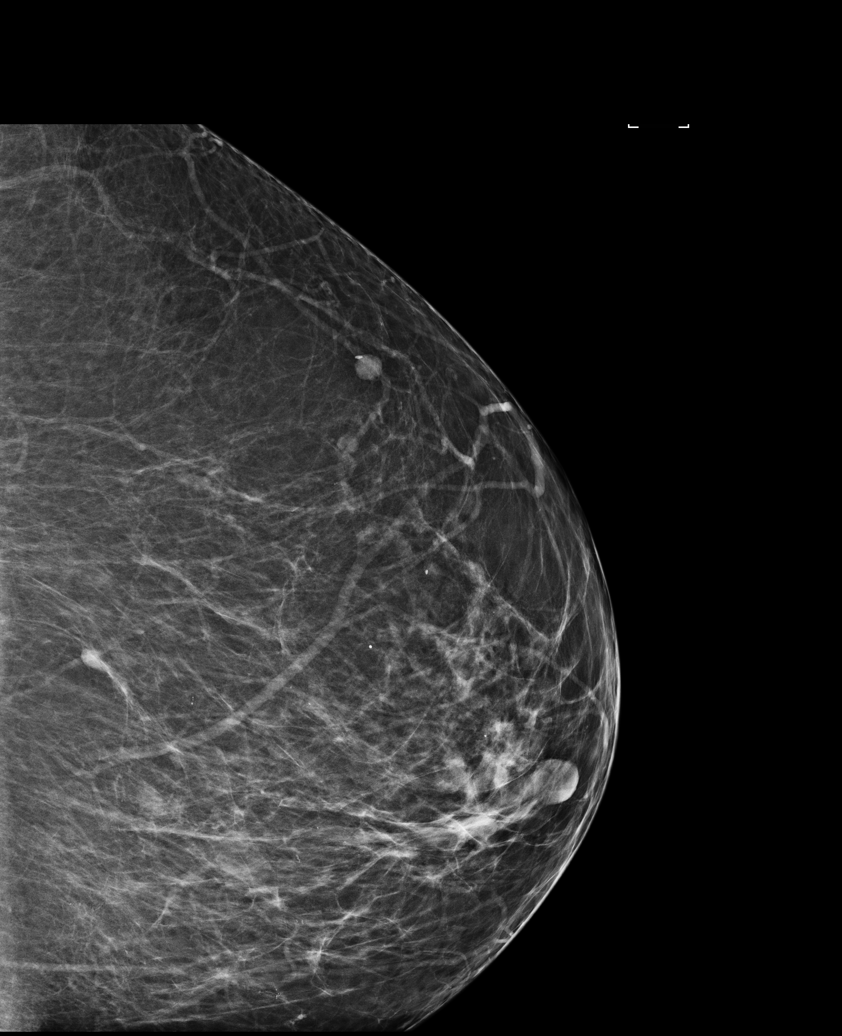

[R CC]
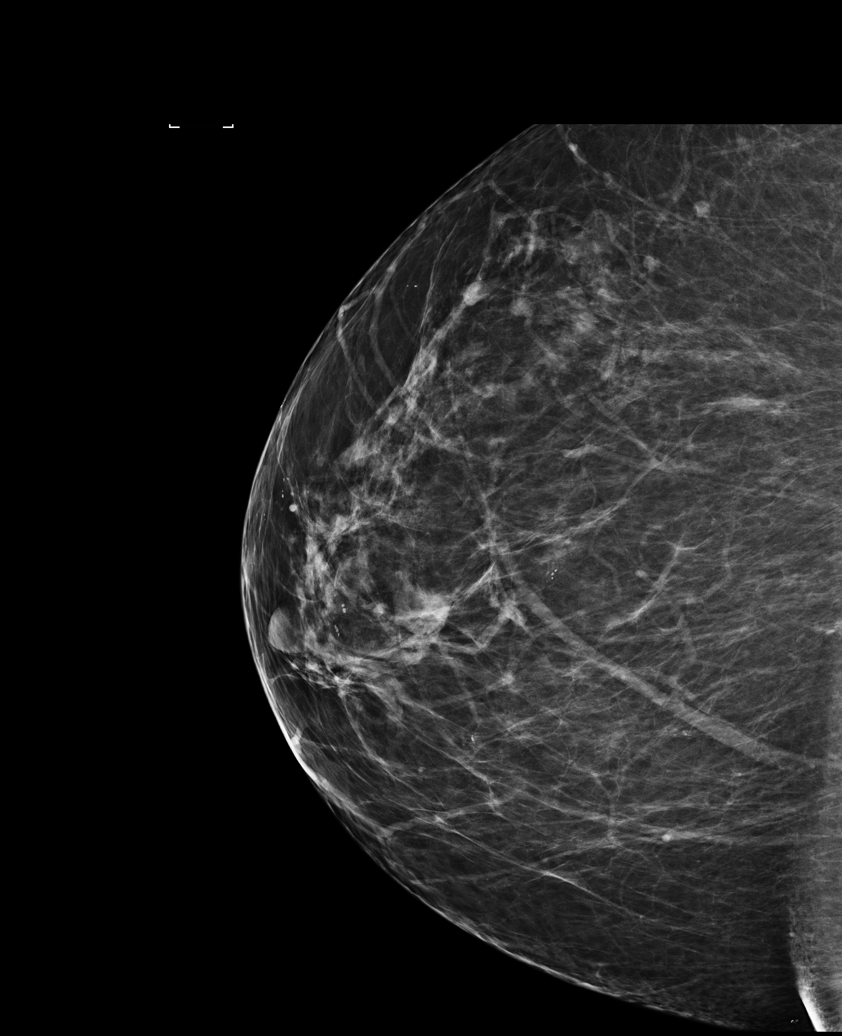

[L MLO tomo · tomo slice 50/99.0]
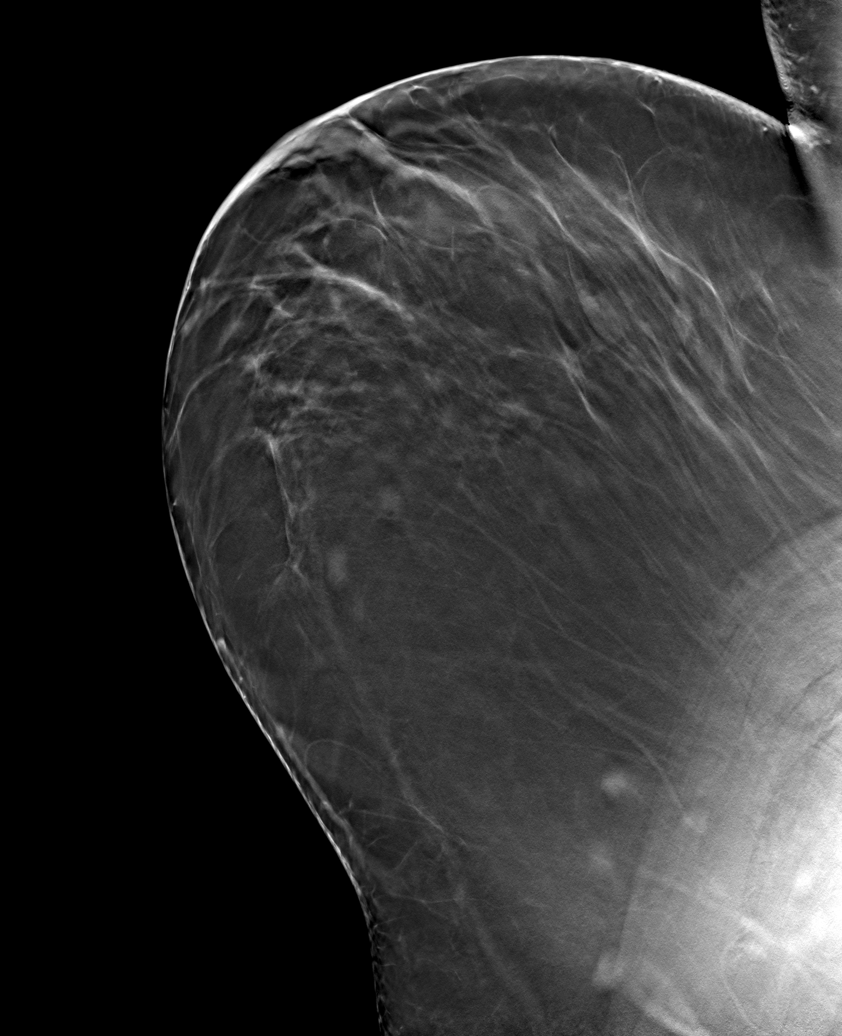

[6 of 25 positions shown; findings below may reference images not displayed]

ACR Breast Density Category b: There are scattered areas of
fibroglandular density.
FINDINGS: There are no findings suspicious for malignancy. Images were
processed with CAD.
IMPRESSION: No mammographic evidence of malignancy. A result letter of this
screening mammogram will be mailed directly to the patient.

RECOMMENDATION:
Screening mammogram in one year. (Code:55-L-23V)

BI-RADS CATEGORY  1: Negative.

## 2015-06-10 ENCOUNTER — Other Ambulatory Visit: Payer: Self-pay | Admitting: Otolaryngology

## 2015-06-10 DIAGNOSIS — E041 Nontoxic single thyroid nodule: Secondary | ICD-10-CM

## 2015-06-10 DIAGNOSIS — E042 Nontoxic multinodular goiter: Secondary | ICD-10-CM

## 2015-06-18 ENCOUNTER — Institutional Professional Consult (permissible substitution): Payer: 59 | Admitting: Neurology

## 2015-06-27 DIAGNOSIS — F329 Major depressive disorder, single episode, unspecified: Secondary | ICD-10-CM | POA: Diagnosis not present

## 2015-06-27 DIAGNOSIS — Z1389 Encounter for screening for other disorder: Secondary | ICD-10-CM | POA: Diagnosis not present

## 2015-07-02 ENCOUNTER — Ambulatory Visit
Admission: RE | Admit: 2015-07-02 | Discharge: 2015-07-02 | Disposition: A | Discharge status: Home / Self Care | Payer: Medicare Other | Source: Ambulatory Visit | Attending: Otolaryngology | Admitting: Otolaryngology

## 2015-07-02 ENCOUNTER — Other Ambulatory Visit (HOSPITAL_COMMUNITY)
Admission: RE | Admit: 2015-07-02 | Discharge: 2015-07-02 | Disposition: A | Discharge status: Home / Self Care | Payer: Medicare Other | Source: Ambulatory Visit | Attending: Interventional Radiology | Admitting: Interventional Radiology

## 2015-07-02 DIAGNOSIS — E041 Nontoxic single thyroid nodule: Secondary | ICD-10-CM | POA: Diagnosis not present

## 2015-07-02 DIAGNOSIS — E042 Nontoxic multinodular goiter: Secondary | ICD-10-CM

## 2015-07-28 ENCOUNTER — Ambulatory Visit (INDEPENDENT_AMBULATORY_CARE_PROVIDER_SITE_OTHER): Payer: Medicare Other | Admitting: Neurology

## 2015-07-28 ENCOUNTER — Encounter: Payer: Self-pay | Admitting: Neurology

## 2015-07-28 VITALS — BP 142/92 | HR 72 | Resp 16 | Ht 66.0 in | Wt 232.0 lb

## 2015-07-28 DIAGNOSIS — R519 Headache, unspecified: Secondary | ICD-10-CM

## 2015-07-28 DIAGNOSIS — G4733 Obstructive sleep apnea (adult) (pediatric): Secondary | ICD-10-CM

## 2015-07-28 DIAGNOSIS — R351 Nocturia: Secondary | ICD-10-CM | POA: Diagnosis not present

## 2015-07-28 DIAGNOSIS — R51 Headache: Secondary | ICD-10-CM

## 2015-07-28 DIAGNOSIS — G478 Other sleep disorders: Secondary | ICD-10-CM

## 2015-07-28 NOTE — Progress Notes (Signed)
Subjective:    Patient ID: Rachel Hess is a 64 y.o. female.  HPI     Star Age, MD, PhD [facility] [address] [place], [place] [address]  Dear Rachel Hess and Dr. Baird Cancer,   I saw your patient, Rachel Hess, upon your kind request in my neurologic clinic today for initial consultation of her sleep disorder, in particular, re-evaluation of her prior diagnosis of obstructive sleep apnea. The patient is unaccompanied today. As you know, Ms. Rachel Hess is a very pleasant 63 year old right-handed woman with an underlying medical history of vitamin D deficiency, hypertension, hyperlipidemia, and morbid obesity, who reports snoring and excessive daytime somnolence. I reviewed your office note from 04/02/2015 which you kindly included. She also had blood work through your office on 04/02/2015: Free T3 was normal, TSH normal, free T4 normal. She had a baseline sleep study and [facility] on 02/27/2014 which I reviewed: Study was interpreted by Dr. Baird Lyons. She weighed 240 pounds at the time with a BMI of 38.7. Neck circumference was documented at 15 inches. She had a sleep efficiency of 87.6%. She had absence of slow-wave sleep and REM sleep at 28.7% with a REM latency of 66.5 minutes. Arousal index was 17.3 per hour. She had a total AHI of 16.4 per hour, REM AHI was 42.9 per hour. She had moderate to loud snoring. Oxygen saturation on average was 93.2%, nadir was 85%. She had PVCs on EKG. She was advised to proceed with a CPAP titration study but did not proceed due to cost. She would be willing to go on CPAP therapy. Sadly, she lost 2 brothers with the last 6 weeks. One brother had complications from his diabetes, the other had a massive stroke. Her main complaint about her sleep is sleep disruption, and nonrestorative sleep. Her Epworth sleepiness score is 2 out of 24, her fatigue score is 17 out of 63. She denies a family history of  obstructive sleep apnea, but is not completely sure. She is a side sleeper. She denies any frank restless leg symptoms and leg twitching at night. She is trying to lose weight. Since her daytime sleep study from April 2015, she Hess lost about 8 pounds. She Hess had a goiter. She had a thyroid ultrasound guided I see on 07/02/2015 but results are pending. She is waiting for her test results and Hess called your office a couple of times she states.  she reports occasional morning headaches about once every 2 weeks or so. She Hess nocturia about once per night on average. She works full-time as an Software engineer for [employer], working for Clinical biochemist of transportation. She Hess 1 grandson and 2 granddaughters. She quit smoking in 1990, she drinks one cup of coffee per day, she drinks alcohol very occasionally. She lives alone and Hess done so since 1990.   Her Past Medical History Is Significant For: Past Medical History  Diagnosis Date  . Allergy   . Vitamin D deficiency   . Morbid obesity   . Hyperlipemia   . Hypertension   . Fibromyalgia     Her Past Surgical History Is Significant For: Past Surgical History  Procedure Laterality Date  . Abdominal hysterectomy    . Cholecystectomy      Her Family History Is Significant For: Family History  Problem Relation Age of Onset  . Heart disease Mother   . Cancer Father     Her Social History Is Significant For:  Social History   Social History  . Marital Status: Single    Spouse Name: Annalee Genta  . Number of Children: 1  . Years of Education: 37   Social History Main Topics  . Smoking status: Former Research scientist (life sciences)  . Smokeless tobacco: None     Comment: Quit 1990  . Alcohol Use: 0.0 oz/week    0 Standard drinks or equivalent per week  . Drug Use: No  . Sexual Activity:    Partners: Male   Other Topics Concern  . None   Social History Narrative   Drinks about 1 cup of coffee a day     Her Allergies Are:  No Known  Allergies:   Her Current Medications Are:  Outpatient Encounter Prescriptions as of 07/28/2015  Medication Sig  . fexofenadine-pseudoephedrine (ALLEGRA-D 24) 180-240 MG per 24 hr tablet Take 1 tablet by mouth daily.  Marland Kitchen ibuprofen (ADVIL,MOTRIN) 800 MG tablet   . [DISCONTINUED] albuterol (ACCUNEB) 0.63 MG/3ML nebulizer solution Take 1 ampule by nebulization every 6 (six) hours as needed for wheezing.  . [DISCONTINUED] naproxen sodium (ANAPROX) 220 MG tablet Take 220 mg by mouth 3 (three) times daily with meals.  . [DISCONTINUED] promethazine (PHENERGAN) 25 MG tablet Take 25 mg by mouth every 6 (six) hours as needed for nausea or vomiting.   No facility-administered encounter medications on file as of 07/28/2015.  :  Review of Systems:  Out of a complete 14 point review of systems, all are reviewed and negative with the exception of these symptoms as listed below:   Review of Systems  Neurological:       Patient falls asleep with TV on, trouble staying asleep, snoring, witnessed apnea, wakes up feeling tired, morning headaches, daytime tiredness, denies taking naps.     Objective:  Neurologic Exam  Physical Exam Physical Examination:   Filed Vitals:   07/28/15 0955  BP: 142/92  Pulse: 72  Resp: 16    General Examination: The patient is a very pleasant 64 y.o. female in no acute distress. She appears well-developed and well-nourished and well groomed.   HEENT: Normocephalic, atraumatic, pupils are equal, round and reactive to light and accommodation. Funduscopic exam is normal with sharp disc margins noted. Extraocular tracking is good without limitation to gaze excursion or nystagmus noted. Normal smooth pursuit is noted. Hearing is grossly intact. Tympanic membranes are clear bilaterally. Face is symmetric with normal facial animation and normal facial sensation. Speech is clear with no dysarthria noted. There is no hypophonia. There is no lip, neck/head, jaw or voice tremor. Neck is  supple with full range of passive and active motion. There are no carotid bruits on auscultation. Oropharynx exam reveals: mild mouth dryness, adequate dental hygiene and mild airway crowding, due to  narrow airway entry and elongated tongue. Tonsils are absent. Of note, she had a tonsillectomy at age 41. Mallampati is class II. Tongue protrudes centrally and palate elevates symmetrically. Her neck circumference is 16-1/4 inches. She does appear to have a goiter.   Chest: Clear to auscultation without wheezing, rhonchi or crackles noted.  Heart: S1+S2+0, regular and normal without murmurs, rubs or gallops noted.   Abdomen: Soft, non-tender and non-distended with normal bowel sounds appreciated on auscultation.  Extremities: There is no pitting edema  but nonpitting puffiness in both distal lower extremities bilaterally. Right leg caliber is slightly smaller than left, she reports that this is not new. Pedal pulses are intact.  Skin: Warm and dry without trophic changes noted. There are  no varicose veins.  Musculoskeletal: exam reveals no obvious joint deformities, tenderness or joint swelling or erythema.   Neurologically:  Mental status: The patient is awake, alert and oriented in all 4 spheres. Her immediate and remote memory, attention, language skills and fund of knowledge are appropriate. There is no evidence of aphasia, agnosia, apraxia or anomia. Speech is clear with normal prosody and enunciation. Thought process is linear. Mood is normal and affect is normal.  Cranial nerves II - XII are as described above under HEENT exam. In addition: shoulder shrug is normal with equal shoulder height noted. Motor exam: Normal bulk, strength and tone is noted. There is no drift, tremor or rebound. Romberg is negative. Reflexes are 2+ throughout. Babinski: Toes are flexor bilaterally. Fine motor skills and coordination: intact with normal finger taps, normal hand movements, normal rapid alternating  patting, normal foot taps and normal foot agility.  Cerebellar testing: No dysmetria or intention tremor on finger to nose testing. Heel to shin is slightly difficult for her bilaterally, most likely secondary to body habitus. There is no truncal or gait ataxia.  Sensory exam: intact to light touch, pinprick, vibration, temperature sense in the upper and lower extremities.  Gait, station and balance: She stands easily. No veering to one side is noted. No leaning to one side is noted. Posture is age-appropriate and stance is narrow based. Gait shows normal stride length and normal pace. No problems turning are noted. She turns en bloc. Tandem walk is slightly challenging for her.  Assessment and Plan:   In summary, TANDI HANKO is a very pleasant 64 y.o.-year old female with an underlying medical history of vitamin D deficiency, hypertension, hyperlipidemia, and morbid obesity, whose history and physical exam are in keeping with obstructive sleep apnea (OSA). She had a baseline sleep study last year on 02/27/14 and we discussed the results.  I had a long chat with the patient about my findings and the diagnosis of OSA, its prognosis and treatment options. We talked about medical treatments, surgical interventions and non-pharmacological approaches. I explained in particular the risks and ramifications of untreated moderate to severe OSA, especially with respect to developing cardiovascular disease down the Road, including congestive heart failure, difficult to treat hypertension, cardiac arrhythmias, or stroke. Even type 2 diabetes Hess, in part, been linked to untreated OSA. Symptoms of untreated OSA include daytime sleepiness, memory problems, mood irritability and mood disorder such as depression and anxiety, lack of energy, as well as recurrent headaches, especially morning headaches. We talked about trying to maintain a healthy lifestyle in general, as well as the importance of weight control. I  encouraged the patient to eat healthy, exercise daily and keep well hydrated, to keep a scheduled bedtime and wake time routine, to not skip any meals and eat healthy snacks in between meals. I advised the patient not to drive when feeling sleepy. I recommended the following at this time: sleep study with positive airway pressure titration. (We will score hypopneas at 4%).   I explained the sleep test procedure to the patient and also outlined possible surgical and non-surgical treatment options of OSA, including the use of a custom-made dental device (which would require a referral to a specialist dentist or oral surgeon), upper airway surgical options, such as pillar implants, radiofrequency surgery, tongue base surgery, and UPPP (which would involve a referral to an ENT surgeon). Rarely, jaw surgery such as mandibular advancement may be considered.  I also explained the CPAP treatment option to  the patient, who indicated that she would be willing to use CPAP. I explained the importance of being compliant with PAP treatment, not only for insurance purposes but primarily to improve Her symptoms, and for the patient's long term health benefit, including to reduce Her cardiovascular risks. I answered all her questions today and the patient was in agreement. I would like to see her back after the sleep study is completed and encouraged her to call with any interim questions, concerns, problems or updates.   Of note, the patient had a thyroid biopsy under ultrasound guidance on 07/02/2015 but results are not in her chart and she is waiting for a phone call back from your office about test results.   Thank you very much for allowing me to participate in the care of this nice patient. If I can be of any further assistance to you please do not hesitate to call me at [phone].  Sincerely,   Star Age, MD, PhD

## 2015-07-28 NOTE — Patient Instructions (Signed)
You have been diagnosed with moderate to severe obstructive sleep apnea last year and I think we should proceed with a sleep study to initiate treatment with CPAP. Please remember, the risks and ramifications of moderate to severe obstructive sleep apnea or OSA are: Cardiovascular disease, including congestive heart failure, stroke, difficult to control hypertension, arrhythmias, and even type 2 diabetes has been linked to untreated OSA. Sleep apnea causes disruption of sleep and sleep deprivation in most cases, which, in turn, can cause recurrent headaches, problems with memory, mood, concentration, focus, and vigilance. Most people with untreated sleep apnea report excessive daytime sleepiness, which can affect their ability to drive. Please do not drive if you feel sleepy.   I will likely see you back after your sleep study to go over the test results and how CPAP is working for you. We will call you after your sleep study to advise about the results (most likely, you will hear from Beverlee Nims, my nurse) and to set up an appointment at the time, as necessary.    Our sleep lab administrative assistant, Arrie Aran will meet with you or call you to schedule your sleep study. If you don't hear back from her by next week please feel free to call her at [phone]. This is her direct line and please leave a message with your phone number to call back if you get the voicemail box. She will call back as soon as possible.

## 2015-08-17 ENCOUNTER — Encounter: Payer: Self-pay | Admitting: Physician Assistant

## 2015-08-17 ENCOUNTER — Ambulatory Visit (INDEPENDENT_AMBULATORY_CARE_PROVIDER_SITE_OTHER): Payer: Medicare Other | Admitting: Physician Assistant

## 2015-08-17 ENCOUNTER — Telehealth: Payer: Self-pay | Admitting: Physician Assistant

## 2015-08-17 VITALS — BP 114/80 | HR 77 | Temp 98.2°F | Resp 14 | Ht 64.2 in | Wt 237.2 lb

## 2015-08-17 DIAGNOSIS — L089 Local infection of the skin and subcutaneous tissue, unspecified: Secondary | ICD-10-CM

## 2015-08-17 DIAGNOSIS — L03113 Cellulitis of right upper limb: Secondary | ICD-10-CM

## 2015-08-17 DIAGNOSIS — L723 Sebaceous cyst: Secondary | ICD-10-CM | POA: Diagnosis not present

## 2015-08-17 MED ORDER — DOXYCYCLINE HYCLATE 100 MG PO CAPS
100.0000 mg | ORAL_CAPSULE | Freq: Two times a day (BID) | ORAL | Status: DC
Start: 2015-08-17 — End: 2015-10-08

## 2015-08-17 NOTE — Progress Notes (Signed)
Patient ID: Rachel Hess, female    DOB: 1951-01-10, 64 y.o.   MRN: [id number]  PCP: Maximino Greenland, MD  Subjective:   Chief Complaint  Patient presents with  . Rash    right arm, x 1 day    HPI Presents for evaluation of a rash.   Yesterday, she noticed a rash on her right upper arm. She is unsure if she got bit, or why the area became red. She noticed that her arm was getting sore and that is what brought her attention to the area. She tried to wash it well and put alcohol on it but that did not do anything. She feels like the area is spreading. It is not itchy. No fever or chills. No drainage from the wound.  She has a history of an open comedone in this area.    Review of Systems  Constitutional: Negative for fever and chills.  Gastrointestinal: Negative for nausea.  Skin: Positive for rash.  Neurological: Negative for dizziness and numbness.  Hematological: Negative for adenopathy.       Patient Active Problem List   Diagnosis Date Noted  . Diarrhea 03/12/2014  . Abdominal discomfort 03/12/2014  . Nausea alone 03/12/2014  . Dehydration, mild 03/12/2014  . Observed sleep apnea 01/16/2014  . Prediabetes 01/16/2014  . Edema 01/16/2014  . Lower extremity edema 12/05/2013  . Weight gain 12/05/2013  . Vaginal dryness 12/05/2013  . Chronic constipation 12/05/2013  . Neck fullness 12/05/2013     Prior to Admission medications   Medication Sig Start Date End Date Taking? Authorizing Provider  fexofenadine-pseudoephedrine (ALLEGRA-D 24) 180-240 MG per 24 hr tablet Take 1 tablet by mouth daily.   Yes Historical Provider, MD  ibuprofen (ADVIL,MOTRIN) 800 MG tablet  05/14/15  Yes Historical Provider, MD  doxycycline (VIBRAMYCIN) 100 MG capsule Take 1 capsule (100 mg total) by mouth 2 (two) times daily. 08/17/15   Lakai Moree, PA-C     No Known Allergies     Objective:  Physical Exam  Constitutional: She is oriented to person, place, and time. She appears  well-developed and well-nourished. She is active and cooperative. No distress.  BP 114/80 mmHg  Pulse 77  Temp(Src) 98.2 F (36.8 C) (Oral)  Resp 14  Ht 5' 4.2" (1.631 m)  Wt 237 lb 3.2 oz (107.593 kg)  BMI 40.45 kg/m2  SpO2 98%   Eyes: Conjunctivae are normal.  Pulmonary/Chest: Effort normal.  Neurological: She is alert and oriented to person, place, and time.  Skin: Skin is warm and dry. Lesion noted. There is erythema.  A 1 mm open comedone noted at the upper pole of a 4-5 cm area of erythema. Indurated. Tender. Not fluctuant.   Psychiatric: She has a normal mood and affect. Her speech is normal and behavior is normal.    Verbal consent obtained. Local anesthesia to posterior aspect of right upper arm with 2 cc of 2% lidocaine without epinephrine. Wound cleansed with betadine. 6 mm punch biopsy made. Cyst sac, sebaceous material and purulence expressed. Wound culture obtained. Wound cavity explored as tolerated by patient, at least 1 cm in a superior aspect. Wound closed with 4-0 Prolene sutures with one vertical mattress suture and two simple interrupted sutures. Cleansed and dressed.        Assessment & Plan:   1. Cellulitis of right upper extremity 2. Infected sebaceous cyst of skin Anticipatory guidance provided. RTC 10 days for suture removal, sooner if she develops increasing pain, erythema, swelling  or develops drainage. - doxycycline (VIBRAMYCIN) 100 MG capsule; Take 1 capsule (100 mg total) by mouth 2 (two) times daily.  Dispense: 20 capsule; Refill: 0 - Wound culture    Fara Chute, PA-C Physician Assistant-Certified [facility]

## 2015-08-17 NOTE — Patient Instructions (Addendum)

## 2015-08-17 NOTE — Telephone Encounter (Signed)
Notified of STAT HIV results: NON REACTIVE.  Will contact her with the results of the Hepatitis tests when they are available.

## 2015-08-17 NOTE — Progress Notes (Signed)
Subjective:     Patient ID: Rachel Hess, female   DOB: Nov 02, 1951, 64 y.o.   MRN: [id number] PCP: Maximino Greenland, MD  Chief Complaint  Patient presents with  . Rash    right arm, x 1 day    HPI Patient presents today for evaluation of a rash.  Yesterday, she noticed a rash on her right upper arm. She is unsure if she got bit, or why the area became red. She noticed that her arm was getting sore and that is what brought her attention to the area. She tried to wash it well and put alcohol on it but that did not do anything. She feels like the area is spreading. It is not itchy. No fever or chills. No drainage from the wound.   She has a history of an open comedone in this area.    Review of Systems  Constitutional: Negative for fever and chills.  Skin: Positive for rash.     Patient Active Problem List   Diagnosis Date Noted  . Diarrhea 03/12/2014  . Abdominal discomfort 03/12/2014  . Nausea alone 03/12/2014  . Dehydration, mild 03/12/2014  . Observed sleep apnea 01/16/2014  . Prediabetes 01/16/2014  . Edema 01/16/2014  . Lower extremity edema 12/05/2013  . Weight gain 12/05/2013  . Vaginal dryness 12/05/2013  . Chronic constipation 12/05/2013  . Neck fullness 12/05/2013    Prior to Admission medications   Medication Sig Start Date End Date Taking? Authorizing Provider  fexofenadine-pseudoephedrine (ALLEGRA-D 24) 180-240 MG per 24 hr tablet Take 1 tablet by mouth daily.   Yes Historical Provider, MD  ibuprofen (ADVIL,MOTRIN) 800 MG tablet  05/14/15  Yes Historical Provider, MD    No Known Allergies    Objective:  Physical Exam  Constitutional: She is oriented to person, place, and time. She appears well-developed and well-nourished.  HENT:  Head: Normocephalic and atraumatic.  Musculoskeletal:       Arms: Neurological: She is alert and oriented to person, place, and time.  Skin: Skin is warm and dry. Rash noted.  Psychiatric: She has a normal mood and  affect. Her behavior is normal. Thought content normal.    BP 114/80 mmHg  Pulse 77  Temp(Src) 98.2 F (36.8 C) (Oral)  Resp 14  Ht 5' 4.2" (1.631 m)  Wt 237 lb 3.2 oz (107.593 kg)  BMI 40.45 kg/m2  SpO2 98%   Verbal consent obtained. Local anesthesia to posterior aspect of right upper arm with 2 cc of 2% lidocaine without epinephrine. Wound cleansed with betadine. 6 mm punch biopsy made. Cyst material removed and wound culture taken. Wound cavity explored as tolerated by patient, at least 1 cm in a superior aspect. Wound closed with 4-0 Prolene sutures with one vertical mattress suture and two simple interrupted sutures. Cleansed and dressed.  Assessment & Plan:  1. Cellulitis of right upper extremity Cyst material was removed and wound was closed. Doxycycline given. Await wound culture. Counseled on keeping area clean and dry.   - doxycycline (VIBRAMYCIN) 100 MG capsule; Take 1 capsule (100 mg total) by mouth 2 (two) times daily.  Dispense: 20 capsule; Refill: 0 - Wound culture  2. Infected sebaceous cyst of skin See above.  Follow-up in 7-10 days for suture removal, or earlier as needed.    Ercole Georg D. Race, PA-S Physician Assistant Student [facility]

## 2015-08-17 NOTE — Progress Notes (Signed)
While closing surgical wound, PA student was stuck in the finger with the suture needle.  Exposure labs drawn. No known  HIV, hepatitis B or Hepatitis C.  STAT HIV NON-REACTIVE. Source and exposed patients notified.

## 2015-08-17 NOTE — Addendum Note (Signed)
Addended by: Fara Chute on: 08/17/2015 05:36 PM   Modules accepted: Level of Service

## 2015-08-20 LAB — WOUND CULTURE
GRAM STAIN: NONE SEEN
Organism ID, Bacteria: NO GROWTH

## 2015-08-27 ENCOUNTER — Ambulatory Visit (INDEPENDENT_AMBULATORY_CARE_PROVIDER_SITE_OTHER): Payer: Medicare Other | Admitting: Neurology

## 2015-08-27 ENCOUNTER — Ambulatory Visit (INDEPENDENT_AMBULATORY_CARE_PROVIDER_SITE_OTHER): Payer: Medicare Other | Admitting: Physician Assistant

## 2015-08-27 VITALS — BP 118/82 | HR 74 | Temp 98.3°F | Resp 20 | Ht 65.0 in | Wt 235.0 lb

## 2015-08-27 DIAGNOSIS — G4733 Obstructive sleep apnea (adult) (pediatric): Secondary | ICD-10-CM | POA: Diagnosis not present

## 2015-08-27 DIAGNOSIS — Z4802 Encounter for removal of sutures: Secondary | ICD-10-CM

## 2015-08-27 DIAGNOSIS — L089 Local infection of the skin and subcutaneous tissue, unspecified: Secondary | ICD-10-CM

## 2015-08-27 DIAGNOSIS — G4761 Periodic limb movement disorder: Secondary | ICD-10-CM

## 2015-08-27 DIAGNOSIS — G479 Sleep disorder, unspecified: Secondary | ICD-10-CM

## 2015-08-27 DIAGNOSIS — L723 Sebaceous cyst: Secondary | ICD-10-CM

## 2015-08-27 NOTE — Progress Notes (Signed)
   08/27/2015 at [place] / DOB: 05-Jan-1951 / MRN: [id number]  The patient has Lower extremity edema; Weight gain; Vaginal dryness; Chronic constipation; Neck fullness; Observed sleep apnea; Prediabetes; Edema; Diarrhea; Abdominal discomfort; Nausea alone; and Dehydration, mild on her problem list.  SUBJECTIVE  Rachel Hess is a well appearing 64 y.o. here today for wound care. She denies exquisite tenderness at the site of the wound, and denies nausea, emesis, fever and chills.  She has been compliant with medical therapy and recommendations thus far.    She  has a past medical history of Allergy; Vitamin D deficiency; Morbid obesity ([place]); Hyperlipemia; Hypertension; and Fibromyalgia.    Medications reviewed and updated by myself where necessary, and exist elsewhere in the encounter.   Rachel Hess has No Known Allergies. She  reports that she has quit smoking. She has never used smokeless tobacco. She reports that she drinks alcohol. She reports that she does not use illicit drugs. She  reports that she currently engages in sexual activity and has had female partners. The patient  has past surgical history that includes Abdominal hysterectomy and Cholecystectomy.  Her family history includes Cancer in her father; Heart disease in her mother.  Review of Systems  Constitutional: Negative for fever.  Gastrointestinal: Negative for nausea.  Neurological: Negative for headaches.    OBJECTIVE  Her  height is 5\' 5"  (1.651 m) and weight is 235 lb (106.595 kg). Her oral temperature is 98.3 F (36.8 C). Her blood pressure is 118/82 and her pulse is 74. Her respiration is 20 and oxygen saturation is 96%.  The patient's body mass index is 39.11 kg/(m^2).  Physical Exam  Vitals reviewed. Musculoskeletal:       Arms:   No results found for this or any previous visit (from the past 24 hour(s)).  ASSESSMENT & PLAN  Rachel Hess was seen today for suture / staple  removal.  Diagnoses and all orders for this visit:  Infected sebaceous cyst of skin: Resolved.   Visit for suture removal: Removed without complication.  F/u PRN.    The patient was advised to call or come back to clinic if she does not see an improvement in symptoms, or worsens with the above plan.   Philis Fendt, MHS, PA-C [facility] 08/27/2015 6:11 PM

## 2015-08-28 NOTE — Sleep Study (Signed)
Please see the scanned sleep study interpretation located in the procedure tab within the chart review section.   

## 2015-08-29 ENCOUNTER — Telehealth: Payer: Self-pay

## 2015-08-29 NOTE — Telephone Encounter (Signed)
Patient is requesting that a copy of her billing statement from her visit on 08/17/2015 be faxed to her at [phone].   Patient call back [phone]

## 2015-09-01 ENCOUNTER — Telehealth: Payer: Self-pay | Admitting: Neurology

## 2015-09-01 DIAGNOSIS — G4733 Obstructive sleep apnea (adult) (pediatric): Secondary | ICD-10-CM

## 2015-09-01 NOTE — Telephone Encounter (Signed)
Patient referred by Dr. Baird Cancer, PSG at [facility] last year, CPAP study 08/27/15, Ins: MCR, [employer]. Please call and inform patient that I have entered an order for treatment with positive airway pressure (PAP) treatment of obstructive sleep apnea (OSA). She did well during the latest sleep study with CPAP. We will, therefore, arrange for a machine for home use through a DME (durable medical equipment) company of Her choice; and I will see the patient back in follow-up in about 8-10 weeks. Please also explain to the patient that I will be looking out for compliance data, which can be downloaded from the machine (stored on an SD card, that is inserted in the machine) or via remote access through a modem, that is built into the machine. At the time of the followup appointment we will discuss sleep study results and how it is going with PAP treatment at home. Please advise patient to bring Her machine at the time of the first FU visit, even though this is cumbersome. Bringing the machine for every visit after that will likely not be needed, but often helps for the first visit to troubleshoot if needed. Please re-enforce the importance of compliance with treatment and the need for [place] to monitor compliance data - often an insurance requirement and actually good feedback for the patient as far as how they are doing.  Also remind patient, that any interim PAP machine or mask issues should be first addressed with the DME company, as they can often help better with technical and mask fit issues. Please ask if patient has a preference regarding DME company.  Please also make sure, the patient has a follow-up appointment with me in about 8-10 weeks from the setup date, thanks.  Once you have spoken to the patient - and faxed/routed report to PCP and referring MD (if other than PCP), you can close this encounter, thanks,   Star Age, MD, PhD [facility] ([place])

## 2015-09-02 NOTE — Telephone Encounter (Signed)
I spoke to patient and gave results and recommendations. I will refer to [facility]. I will fax report to PCP. I will send patient a letter reminding her to make appt and stress the importance of compliance.

## 2015-09-11 ENCOUNTER — Telehealth: Payer: Self-pay

## 2015-09-11 NOTE — Telephone Encounter (Signed)
Xanax prescription has been printed and taken to the front desk for pick up.

## 2015-09-26 DIAGNOSIS — I10 Essential (primary) hypertension: Secondary | ICD-10-CM | POA: Diagnosis not present

## 2015-10-13 ENCOUNTER — Encounter (HOSPITAL_COMMUNITY)
Admission: RE | Admit: 2015-10-13 | Discharge: 2015-10-13 | Disposition: A | Discharge status: Home / Self Care | Payer: Medicare Other | Source: Ambulatory Visit | Attending: Otolaryngology | Admitting: Otolaryngology

## 2015-10-13 ENCOUNTER — Encounter (HOSPITAL_COMMUNITY): Payer: Self-pay

## 2015-10-13 ENCOUNTER — Ambulatory Visit (HOSPITAL_COMMUNITY)
Admission: RE | Admit: 2015-10-13 | Discharge: 2015-10-13 | Disposition: A | Discharge status: Home / Self Care | Payer: Medicare Other | Source: Ambulatory Visit | Attending: Anesthesiology | Admitting: Anesthesiology

## 2015-10-13 DIAGNOSIS — Z87891 Personal history of nicotine dependence: Secondary | ICD-10-CM | POA: Insufficient documentation

## 2015-10-13 DIAGNOSIS — Z01818 Encounter for other preprocedural examination: Secondary | ICD-10-CM

## 2015-10-13 DIAGNOSIS — Z01812 Encounter for preprocedural laboratory examination: Secondary | ICD-10-CM | POA: Insufficient documentation

## 2015-10-13 DIAGNOSIS — E049 Nontoxic goiter, unspecified: Secondary | ICD-10-CM | POA: Insufficient documentation

## 2015-10-13 DIAGNOSIS — R918 Other nonspecific abnormal finding of lung field: Secondary | ICD-10-CM | POA: Insufficient documentation

## 2015-10-13 DIAGNOSIS — J398 Other specified diseases of upper respiratory tract: Secondary | ICD-10-CM | POA: Diagnosis not present

## 2015-10-13 HISTORY — DX: Nontoxic goiter, unspecified: E04.9

## 2015-10-13 HISTORY — DX: Constipation, unspecified: K59.00

## 2015-10-13 HISTORY — DX: Other seasonal allergic rhinitis: J30.2

## 2015-10-13 LAB — BASIC METABOLIC PANEL
ANION GAP: 7 (ref 5–15)
BUN: 11 mg/dL (ref 6–20)
CALCIUM: 9.5 mg/dL (ref 8.9–10.3)
CO2: 26 mmol/L (ref 22–32)
Chloride: 108 mmol/L (ref 101–111)
Creatinine, Ser: 0.72 mg/dL (ref 0.44–1.00)
GFR calc non Af Amer: >60 mL/min (ref >60)
Glucose, Bld: 108 mg/dL — ABNORMAL HIGH (ref 65–99)
POTASSIUM: 4.1 mmol/L (ref 3.5–5.1)
Sodium: 141 mmol/L (ref 135–145)

## 2015-10-13 LAB — CBC
HEMATOCRIT: 42 % (ref 36.0–46.0)
HEMOGLOBIN: 13.9 g/dL (ref 12.0–15.0)
MCH: 29.3 pg (ref 26.0–34.0)
MCHC: 33.1 g/dL (ref 30.0–36.0)
MCV: 88.6 fL (ref 78.0–100.0)
Platelets: 273 10*3/uL (ref 150–400)
RBC: 4.74 MIL/uL (ref 3.87–5.11)
RDW: 13.7 % (ref 11.5–15.5)
WBC: 8.8 10*3/uL (ref 4.0–10.5)

## 2015-10-13 NOTE — Progress Notes (Signed)
PCP is Priscille Loveless, NP.  Patient denied having any cardiac or pulmonary issues

## 2015-10-13 NOTE — Pre-Procedure Instructions (Signed)
[place]  10/13/2015     Your procedure is scheduled on : Friday October 17, 2015 at 7:30 AM.  Report to [facility] Admitting at 5:30 AM.  Call this number if you have problems the morning of surgery: [phone]    Remember:  Do not eat food or drink liquids after midnight.  Take these medicines the morning of surgery with A SIP OF WATER : Hydroxyzine (Vistaril) if needed   Stop taking any vitamins, herbal medications, Ibuprofen, Advil, Motrin, Aleve, etc   Bring CPAP mask the day of your surgery   Do not wear jewelry, make-up or nail polish.  Do not wear lotions, powders, or perfumes.    Do not shave 48 hours prior to surgery.    Do not bring valuables to the hospital.  [facility] is not responsible for any belongings or valuables.  Contacts, dentures or bridgework may not be worn into surgery.  Leave your suitcase in the car.  After surgery it may be brought to your room.  For patients admitted to the hospital, discharge time will be determined by your treatment team.  Patients discharged the day of surgery will not be allowed to drive home.   Name and phone number of your driver:    Special instructions:  Shower using CHG soap the night before and the morning of your surgery  Please read over the following fact sheets that you were given. Pain Booklet, Coughing and Deep Breathing and Surgical Site Infection Prevention

## 2015-10-17 ENCOUNTER — Encounter (HOSPITAL_COMMUNITY)
Admission: RE | Disposition: A | Discharge status: Home / Self Care | Payer: Self-pay | Source: Ambulatory Visit | Attending: Otolaryngology

## 2015-10-17 ENCOUNTER — Ambulatory Visit (HOSPITAL_COMMUNITY): Payer: Medicare Other | Admitting: Vascular Surgery

## 2015-10-17 ENCOUNTER — Observation Stay (HOSPITAL_COMMUNITY)
Admission: RE | Admit: 2015-10-17 | Discharge: 2015-10-18 | Disposition: A | Discharge status: Home / Self Care | Payer: Medicare Other | Source: Ambulatory Visit | Attending: Otolaryngology | Admitting: Otolaryngology

## 2015-10-17 ENCOUNTER — Encounter (HOSPITAL_COMMUNITY): Payer: Self-pay | Admitting: Anesthesiology

## 2015-10-17 ENCOUNTER — Ambulatory Visit (HOSPITAL_COMMUNITY): Payer: Medicare Other | Admitting: Anesthesiology

## 2015-10-17 DIAGNOSIS — J398 Other specified diseases of upper respiratory tract: Secondary | ICD-10-CM | POA: Diagnosis not present

## 2015-10-17 DIAGNOSIS — M797 Fibromyalgia: Secondary | ICD-10-CM | POA: Diagnosis not present

## 2015-10-17 DIAGNOSIS — Z6837 Body mass index (BMI) 37.0-37.9, adult: Secondary | ICD-10-CM | POA: Insufficient documentation

## 2015-10-17 DIAGNOSIS — E785 Hyperlipidemia, unspecified: Secondary | ICD-10-CM | POA: Insufficient documentation

## 2015-10-17 DIAGNOSIS — E010 Iodine-deficiency related diffuse (endemic) goiter: Secondary | ICD-10-CM

## 2015-10-17 DIAGNOSIS — Z87891 Personal history of nicotine dependence: Secondary | ICD-10-CM | POA: Diagnosis not present

## 2015-10-17 DIAGNOSIS — D34 Benign neoplasm of thyroid gland: Secondary | ICD-10-CM | POA: Diagnosis not present

## 2015-10-17 DIAGNOSIS — E042 Nontoxic multinodular goiter: Secondary | ICD-10-CM | POA: Diagnosis not present

## 2015-10-17 DIAGNOSIS — E559 Vitamin D deficiency, unspecified: Secondary | ICD-10-CM | POA: Diagnosis not present

## 2015-10-17 DIAGNOSIS — G4730 Sleep apnea, unspecified: Secondary | ICD-10-CM | POA: Insufficient documentation

## 2015-10-17 HISTORY — PX: THYROIDECTOMY: SHX17

## 2015-10-17 HISTORY — DX: Dependence on other enabling machines and devices: Z99.89

## 2015-10-17 HISTORY — PX: THYROID LOBECTOMY: SHX420

## 2015-10-17 HISTORY — DX: Obstructive sleep apnea (adult) (pediatric): G47.33

## 2015-10-17 LAB — CBC
HCT: 36.8 % (ref 36.0–46.0)
HEMOGLOBIN: 12.1 g/dL (ref 12.0–15.0)
MCH: 29.5 pg (ref 26.0–34.0)
MCHC: 32.9 g/dL (ref 30.0–36.0)
MCV: 89.8 fL (ref 78.0–100.0)
PLATELETS: 259 10*3/uL (ref 150–400)
RBC: 4.1 MIL/uL (ref 3.87–5.11)
RDW: 14.1 % (ref 11.5–15.5)
WBC: 14.2 10*3/uL — AB (ref 4.0–10.5)

## 2015-10-17 LAB — CREATININE, SERUM
Creatinine, Ser: 0.71 mg/dL (ref 0.44–1.00)
GFR calc non Af Amer: >60 mL/min (ref >60)

## 2015-10-17 SURGERY — THYROIDECTOMY
Anesthesia: General | Site: Neck | Laterality: Left

## 2015-10-17 MED ORDER — ACETAMINOPHEN 650 MG RE SUPP: 650.0000 mg | RECTAL | Status: DC | PRN

## 2015-10-17 MED ORDER — LIDOCAINE-EPINEPHRINE 1 %-1:100000 IJ SOLN
INTRAMUSCULAR | Status: AC
  Filled 2015-10-17: qty 1

## 2015-10-17 MED ORDER — HYDROCODONE-ACETAMINOPHEN 5-325 MG PO TABS
ORAL_TABLET | ORAL | Status: AC
  Filled 2015-10-17: qty 2

## 2015-10-17 MED ORDER — ONDANSETRON HCL 4 MG/2ML IJ SOLN
INTRAMUSCULAR | Status: AC
  Filled 2015-10-17: qty 2

## 2015-10-17 MED ORDER — SUCCINYLCHOLINE CHLORIDE 20 MG/ML IJ SOLN
INTRAMUSCULAR | Status: AC
  Filled 2015-10-17: qty 1

## 2015-10-17 MED ORDER — LIDOCAINE-EPINEPHRINE 1 %-1:100000 IJ SOLN
INTRAMUSCULAR | Status: DC | PRN
  Administered 2015-10-17: 20 mL

## 2015-10-17 MED ORDER — HYDROCODONE-ACETAMINOPHEN 5-325 MG PO TABS: 1.0000 | ORAL_TABLET | Freq: Four times a day (QID) | ORAL | Status: DC | PRN

## 2015-10-17 MED ORDER — HYDROCODONE-ACETAMINOPHEN 5-325 MG PO TABS
1.0000 | ORAL_TABLET | ORAL | Status: DC | PRN
  Administered 2015-10-17 – 2015-10-18 (×3): 2 via ORAL
  Filled 2015-10-17 (×3): qty 2

## 2015-10-17 MED ORDER — DEXTROSE IN LACTATED RINGERS 5 % IV SOLN: INTRAVENOUS | Status: DC

## 2015-10-17 MED ORDER — HEPARIN SODIUM (PORCINE) 5000 UNIT/ML IJ SOLN
5000.0000 [IU] | Freq: Three times a day (TID) | INTRAMUSCULAR | Status: DC
  Administered 2015-10-17 – 2015-10-18 (×3): 5000 [IU] via SUBCUTANEOUS
  Filled 2015-10-17 (×2): qty 1

## 2015-10-17 MED ORDER — ARTIFICIAL TEARS OP OINT
TOPICAL_OINTMENT | OPHTHALMIC | Status: DC | PRN
  Administered 2015-10-17: 1 via OPHTHALMIC

## 2015-10-17 MED ORDER — MIDAZOLAM HCL 5 MG/5ML IJ SOLN
INTRAMUSCULAR | Status: DC | PRN
  Administered 2015-10-17 (×2): 1 mg via INTRAVENOUS

## 2015-10-17 MED ORDER — ONDANSETRON HCL 4 MG/2ML IJ SOLN
INTRAMUSCULAR | Status: DC | PRN
  Administered 2015-10-17: 4 mg via INTRAVENOUS

## 2015-10-17 MED ORDER — HYDROMORPHONE HCL 1 MG/ML IJ SOLN
0.2500 mg | INTRAMUSCULAR | Status: DC | PRN
  Administered 2015-10-17 (×4): 0.5 mg via INTRAVENOUS

## 2015-10-17 MED ORDER — ARTIFICIAL TEARS OP OINT
TOPICAL_OINTMENT | OPHTHALMIC | Status: AC
  Filled 2015-10-17: qty 3.5

## 2015-10-17 MED ORDER — LIDOCAINE HCL (CARDIAC) 20 MG/ML IV SOLN
INTRAVENOUS | Status: DC | PRN
  Administered 2015-10-17: 100 mg via INTRAVENOUS

## 2015-10-17 MED ORDER — CEFAZOLIN SODIUM-DEXTROSE 2-3 GM-% IV SOLR
2000.0000 mg | Freq: Once | INTRAVENOUS | Status: AC
  Administered 2015-10-17: 2 g via INTRAVENOUS

## 2015-10-17 MED ORDER — CEFAZOLIN SODIUM-DEXTROSE 2-3 GM-% IV SOLR
INTRAVENOUS | Status: AC
  Filled 2015-10-17: qty 50

## 2015-10-17 MED ORDER — MORPHINE SULFATE (PF) 2 MG/ML IV SOLN
2.0000 mg | INTRAVENOUS | Status: DC | PRN
Start: 2015-10-17 — End: 2015-10-18
  Administered 2015-10-17: 4 mg via INTRAVENOUS
  Administered 2015-10-17 – 2015-10-18 (×4): 2 mg via INTRAVENOUS
  Filled 2015-10-17: qty 1
  Filled 2015-10-17: qty 2
  Filled 2015-10-17 (×2): qty 1

## 2015-10-17 MED ORDER — HYDROMORPHONE HCL 1 MG/ML IJ SOLN
INTRAMUSCULAR | Status: AC
  Filled 2015-10-17: qty 1

## 2015-10-17 MED ORDER — ONDANSETRON HCL 4 MG/2ML IJ SOLN: 4.0000 mg | Freq: Once | INTRAMUSCULAR | Status: DC | PRN

## 2015-10-17 MED ORDER — 0.9 % SODIUM CHLORIDE (POUR BTL) OPTIME
TOPICAL | Status: DC | PRN
  Administered 2015-10-17: 1000 mL

## 2015-10-17 MED ORDER — MIDAZOLAM HCL 2 MG/2ML IJ SOLN
INTRAMUSCULAR | Status: AC
  Filled 2015-10-17: qty 2

## 2015-10-17 MED ORDER — PROPOFOL 10 MG/ML IV BOLUS
INTRAVENOUS | Status: DC | PRN
Start: 2015-10-17 — End: 2015-10-17
  Administered 2015-10-17: 150 mg via INTRAVENOUS
  Administered 2015-10-17: 20 mg via INTRAVENOUS
  Administered 2015-10-17: 10 mg via INTRAVENOUS

## 2015-10-17 MED ORDER — ACETAMINOPHEN 160 MG/5ML PO SOLN
650.0000 mg | ORAL | Status: DC | PRN
  Administered 2015-10-17: 650 mg via ORAL
  Filled 2015-10-17: qty 20.3

## 2015-10-17 MED ORDER — PROPOFOL 10 MG/ML IV BOLUS
INTRAVENOUS | Status: AC
  Filled 2015-10-17: qty 40

## 2015-10-17 MED ORDER — LACTATED RINGERS IV SOLN
INTRAVENOUS | Status: DC | PRN
  Administered 2015-10-17 (×2): via INTRAVENOUS

## 2015-10-17 MED ORDER — SUCCINYLCHOLINE CHLORIDE 20 MG/ML IJ SOLN
INTRAMUSCULAR | Status: DC | PRN
  Administered 2015-10-17: 140 mg via INTRAVENOUS

## 2015-10-17 MED ORDER — MEPERIDINE HCL 25 MG/ML IJ SOLN: 6.2500 mg | INTRAMUSCULAR | Status: DC | PRN

## 2015-10-17 MED ORDER — PHENYLEPHRINE HCL 10 MG/ML IJ SOLN
INTRAMUSCULAR | Status: DC | PRN
  Administered 2015-10-17 (×2): 40 ug via INTRAVENOUS

## 2015-10-17 MED ORDER — FENTANYL CITRATE (PF) 250 MCG/5ML IJ SOLN
INTRAMUSCULAR | Status: AC
  Filled 2015-10-17: qty 5

## 2015-10-17 MED ORDER — ONDANSETRON HCL 4 MG PO TABS
4.0000 mg | ORAL_TABLET | ORAL | Status: DC | PRN
  Administered 2015-10-18: 4 mg via ORAL
  Filled 2015-10-17: qty 1

## 2015-10-17 MED ORDER — ONDANSETRON HCL 4 MG/2ML IJ SOLN
4.0000 mg | INTRAMUSCULAR | Status: DC | PRN
  Administered 2015-10-17: 4 mg via INTRAVENOUS
  Filled 2015-10-17: qty 2

## 2015-10-17 MED ORDER — LIDOCAINE HCL (CARDIAC) 20 MG/ML IV SOLN
INTRAVENOUS | Status: AC
  Filled 2015-10-17: qty 5

## 2015-10-17 MED ORDER — PHENYLEPHRINE 40 MCG/ML (10ML) SYRINGE FOR IV PUSH (FOR BLOOD PRESSURE SUPPORT)
PREFILLED_SYRINGE | INTRAVENOUS | Status: AC
  Filled 2015-10-17: qty 10

## 2015-10-17 MED ORDER — FENTANYL CITRATE (PF) 100 MCG/2ML IJ SOLN
INTRAMUSCULAR | Status: DC | PRN
  Administered 2015-10-17 (×2): 50 ug via INTRAVENOUS
  Administered 2015-10-17: 100 ug via INTRAVENOUS
  Administered 2015-10-17: 50 ug via INTRAVENOUS

## 2015-10-17 MED ORDER — MORPHINE SULFATE (PF) 2 MG/ML IV SOLN
INTRAVENOUS | Status: AC
  Filled 2015-10-17: qty 1

## 2015-10-17 MED ORDER — BACITRACIN ZINC 500 UNIT/GM EX OINT
TOPICAL_OINTMENT | CUTANEOUS | Status: AC
  Filled 2015-10-17: qty 28.35

## 2015-10-17 MED ORDER — ZOLPIDEM TARTRATE 5 MG PO TABS: 5.0000 mg | ORAL_TABLET | Freq: Every evening | ORAL | Status: DC | PRN

## 2015-10-17 SURGICAL SUPPLY — 57 items
ADH SKN CLS APL DERMABOND .7 (GAUZE/BANDAGES/DRESSINGS) ×1
APPLIER CLIP 9.375 SM OPEN (CLIP)
APR CLP SM 9.3 20 MLT OPN (CLIP)
ATTRACTOMAT 16X20 MAGNETIC DRP (DRAPES) IMPLANT
BLADE SURG 15 STRL LF DISP TIS (BLADE) IMPLANT
BLADE SURG 15 STRL SS (BLADE)
CANISTER SUCTION 2500CC (MISCELLANEOUS) ×2 IMPLANT
CLEANER TIP ELECTROSURG 2X2 (MISCELLANEOUS) ×2 IMPLANT
CLIP APPLIE 9.375 SM OPEN (CLIP) IMPLANT
CORDS BIPOLAR (ELECTRODE) ×2 IMPLANT
COVER SURGICAL LIGHT HANDLE (MISCELLANEOUS) ×2 IMPLANT
DERMABOND ADVANCED (GAUZE/BANDAGES/DRESSINGS) ×1
DERMABOND ADVANCED .7 DNX12 (GAUZE/BANDAGES/DRESSINGS) ×1 IMPLANT
DRAIN HEMOVAC 7FR (DRAIN) IMPLANT
DRAIN SNY 10 ROU (WOUND CARE) ×1 IMPLANT
DRAPE PROXIMA HALF (DRAPES) IMPLANT
ELECT COATED BLADE 2.86 ST (ELECTRODE) ×2 IMPLANT
ELECT PAIRED SUBDERMAL (MISCELLANEOUS) ×2
ELECT REM PT RETURN 9FT ADLT (ELECTROSURGICAL) ×2
ELECTRODE PAIRED SUBDERMAL (MISCELLANEOUS) IMPLANT
ELECTRODE REM PT RTRN 9FT ADLT (ELECTROSURGICAL) ×1 IMPLANT
EVACUATOR SILICONE 100CC (DRAIN) ×2 IMPLANT
GAUZE SPONGE 4X4 16PLY XRAY LF (GAUZE/BANDAGES/DRESSINGS) ×3 IMPLANT
GLOVE BIO SURGEON STRL SZ 6.5 (GLOVE) ×2 IMPLANT
GLOVE BIOGEL M 7.0 STRL (GLOVE) ×2 IMPLANT
GLOVE BIOGEL PI IND STRL 6.5 (GLOVE) IMPLANT
GLOVE BIOGEL PI INDICATOR 6.5 (GLOVE) ×4
GLOVE ECLIPSE 8.0 STRL XLNG CF (GLOVE) ×1 IMPLANT
GLOVE SURG SS PI 6.5 STRL IVOR (GLOVE) ×1 IMPLANT
GOWN STRL REUS W/ TWL LRG LVL3 (GOWN DISPOSABLE) ×3 IMPLANT
GOWN STRL REUS W/TWL LRG LVL3 (GOWN DISPOSABLE) ×8
GOWN STRL REUS W/TWL XL LVL3 (GOWN DISPOSABLE) ×1 IMPLANT
KIT BASIN OR (CUSTOM PROCEDURE TRAY) ×2 IMPLANT
KIT ROOM TURNOVER OR (KITS) ×2 IMPLANT
MARKER SKIN DUAL TIP RULER LAB (MISCELLANEOUS) IMPLANT
NDL HYPO 25GX1X1/2 BEV (NEEDLE) IMPLANT
NEEDLE 27GAX1X1/2 (NEEDLE) ×2 IMPLANT
NEEDLE HYPO 25GX1X1/2 BEV (NEEDLE) ×2 IMPLANT
NS IRRIG 1000ML POUR BTL (IV SOLUTION) ×2 IMPLANT
PAD ARMBOARD 7.5X6 YLW CONV (MISCELLANEOUS) ×4 IMPLANT
PENCIL BUTTON HOLSTER BLD 10FT (ELECTRODE) ×2 IMPLANT
PROBE NERVBE PRASS .33 (MISCELLANEOUS) ×1 IMPLANT
SHEARS HARMONIC 9CM CVD (BLADE) ×2 IMPLANT
SPONGE INTESTINAL PEANUT (DISPOSABLE) ×2 IMPLANT
STAPLER VISISTAT 35W (STAPLE) ×2 IMPLANT
SUT CHROMIC 2 0 SH (SUTURE) ×1 IMPLANT
SUT ETHILON 3 0 PS 1 (SUTURE) ×2 IMPLANT
SUT ETHILON 5 0 PS 2 18 (SUTURE) IMPLANT
SUT SILK 2 0 REEL (SUTURE) IMPLANT
SUT SILK 2 0 SH CR/8 (SUTURE) ×2 IMPLANT
SUT SILK 3 0 REEL (SUTURE) ×2 IMPLANT
SUT SILK 4 0 REEL (SUTURE) IMPLANT
SUT VIC AB 3-0 SH 27 (SUTURE) ×2
SUT VIC AB 3-0 SH 27X BRD (SUTURE) ×1 IMPLANT
SUT VICRYL 4-0 PS2 18IN ABS (SUTURE) ×1 IMPLANT
TOWEL OR 17X24 6PK STRL BLUE (TOWEL DISPOSABLE) ×2 IMPLANT
TRAY ENT MC OR (CUSTOM PROCEDURE TRAY) ×2 IMPLANT

## 2015-10-17 NOTE — Brief Op Note (Signed)
10/17/2015  9:53 AM  PATIENT:  Rachel Hess  64 y.o. female  PRE-OPERATIVE DIAGNOSIS:  THYROID NODULE  POST-OPERATIVE DIAGNOSIS:  THYROID NODULE  PROCEDURE:  Procedure(s): LEFT THYROIDECTOMY  (Left)  SURGEON:  Surgeon(s) and Role:    * Jerrell Belfast, MD - Primary    * Jodi Marble, MD - Assisting  PHYSICIAN ASSISTANT:   ASSISTANTS: Dr. Erik Obey   ANESTHESIA:   general  EBL:  Total I/O In: 1000 [I.V.:1000] Out: 150 [Blood:150]  BLOOD ADMINISTERED:none  DRAINS: (10 Fr) Jackson-Pratt drain(s) with closed bulb suction in the Left neck   LOCAL MEDICATIONS USED:  LIDOCAINE  and Amount: 4 ml  SPECIMEN:  Source of Specimen:  left Thyroid gland  DISPOSITION OF SPECIMEN:  PATHOLOGY  COUNTS:  YES  TOURNIQUET:  * No tourniquets in log *  DICTATION: .Other Dictation: Dictation Number [id number]  PLAN OF CARE: Admit for overnight observation  PATIENT DISPOSITION:  PACU - hemodynamically stable.   Delay start of Pharmacological VTE agent (>24hrs) due to surgical blood loss or risk of bleeding: not applicable

## 2015-10-17 NOTE — Transfer of Care (Signed)
Immediate Anesthesia Transfer of Care Note  Patient: Rachel Hess  Procedure(s) Performed: Procedure(s): LEFT THYROIDECTOMY  (Left)  Patient Location: PACU  Anesthesia Type:General  Level of Consciousness: awake, alert  and patient cooperative  Airway & Oxygen Therapy: Patient Spontanous Breathing and Patient connected to face mask oxygen  Post-op Assessment: Report given to RN, Post -op Vital signs reviewed and stable and Patient moving all extremities  Post vital signs: Reviewed and stable  Last Vitals:  Filed Vitals:   10/17/15 0639  BP: 142/76  Pulse: 67  Temp: 36.2 C  Resp: 20   Complications: none noted

## 2015-10-17 NOTE — Anesthesia Postprocedure Evaluation (Signed)
Anesthesia Post Note  Patient: Seltzer  Procedure(s) Performed: Procedure(s) (LRB): LEFT THYROIDECTOMY  (Left)  Patient location during evaluation: PACU Anesthesia Type: General Level of consciousness: awake and alert Pain management: pain level controlled Vital Signs Assessment: post-procedure vital signs reviewed and stable Respiratory status: spontaneous breathing, nonlabored ventilation, respiratory function stable and patient connected to nasal cannula oxygen Cardiovascular status: blood pressure returned to baseline and stable Postop Assessment: no signs of nausea or vomiting Anesthetic complications: no    Last Vitals:  Filed Vitals:   10/17/15 1258 10/17/15 1340  BP:  121/59  Pulse:  66  Temp: 36.5 C   Resp:      Last Pain:  Filed Vitals:   10/17/15 1342  PainSc: [employer]                 Elba Schaber DAVID

## 2015-10-17 NOTE — H&P (Signed)
Rachel Hess is an 64 y.o. female.   Chief Complaint: Left Thyromegaly HPI: Progressive Left Thyromegaly, hx of MNG  Past Medical History  Diagnosis Date  . Allergy   . Vitamin D deficiency   . Morbid obesity ([place])   . Hyperlipemia   . Fibromyalgia   . Goiter   . Pneumonia     hx of  . Constipation   . Seasonal allergies   . Sleep apnea     has CPAP machine    Past Surgical History  Procedure Laterality Date  . Abdominal hysterectomy    . Cholecystectomy    . Carpal tunnel release Left   . Colonoscopy w/ polypectomy      Family History  Problem Relation Age of Onset  . Heart disease Mother   . Cancer Father    Social History:  reports that she has quit smoking. She has never used smokeless tobacco. She reports that she drinks alcohol. She reports that she does not use illicit drugs.  Allergies: No Known Allergies  Medications Prior to Admission  Medication Sig Dispense Refill  . fexofenadine-pseudoephedrine (ALLEGRA-D 24) 180-240 MG per 24 hr tablet Take 1 tablet by mouth daily as needed.     . hydrOXYzine (VISTARIL) 50 MG capsule Take 50 mg by mouth every 6 (six) hours as needed for anxiety.     Marland Kitchen ibuprofen (ADVIL,MOTRIN) 800 MG tablet Take 800 mg by mouth every 8 (eight) hours as needed for mild pain or moderate pain.       No results found for this or any previous visit (from the past 48 hour(s)). No results found.  Review of Systems  Constitutional: Negative.   HENT: Negative.   Respiratory: Negative.   Cardiovascular: Negative.   Gastrointestinal: Negative.     Blood pressure 142/76, pulse 67, temperature 97.1 F (36.2 C), temperature source Oral, resp. rate 20, height 5\' 6"  (1.676 m), weight 106.624 kg (235 lb 1 oz), SpO2 96 %. Physical Exam  Constitutional: She is oriented to person, place, and time. She appears well-developed and well-nourished.  Neck: Normal range of motion. Neck supple. Thyromegaly present.  Cardiovascular: Normal rate.    Respiratory: Effort normal.  GI: Soft.  Musculoskeletal: Normal range of motion.  Neurological: She is alert and oriented to person, place, and time.     Assessment/Plan Adm for Left Thyroidectomy under [place], Sebrena Engh 10/17/2015, 7:18 AM

## 2015-10-17 NOTE — Progress Notes (Signed)
[place] [id number] Admission Data: 10/17/2015 4:26 PM Attending Provider: Jerrell Belfast, MD [id number] N, MD Code Status: full  Rachel Hess is a 64 y.o. female patient admitted from ED:  -No acute distress noted.  -No complaints of shortness of breath.  -No complaints of chest pain.   Cardiac Monitoring:  Blood pressure 117/61, pulse 64, temperature 97.7 F (36.5 C), temperature source Oral, resp. rate 12, height 5\' 6"  (1.676 m), weight 106.624 kg (235 lb 1 oz), SpO2 98 %.   IV Fluids:  IV in place, occlusive dsg intact without redness, IV cath hand right, condition patent and no redness D5lactated Ringer.   Allergies:  Review of patient's allergies indicates no known allergies.  Past Medical History:   has a past medical history of Allergy; Vitamin D deficiency; Morbid obesity ([place]); Hyperlipemia; Fibromyalgia; Goiter; Pneumonia; Constipation; Seasonal allergies; and Sleep apnea.  Past Surgical History:   has past surgical history that includes Abdominal hysterectomy; Cholecystectomy; Carpal tunnel release (Left); and Colonoscopy w/ polypectomy.  Social History:   reports that she has quit smoking. She has never used smokeless tobacco. She reports that she drinks alcohol. She reports that she does not use illicit drugs.  Skin: intact with neck incision closed with dermabond  Patient/Family orientated to room. Information packet given to patient/family. Admission inpatient armband information verified with patient/family to include name and date of birth and placed on patient arm. Side rails up x 2, fall assessment and education completed with patient/family. Patient/family able to verbalize understanding of risk associated with falls and verbalized understanding to call for assistance before getting out of bed. Call light within reach. Patient/family able to voice and demonstrate understanding of unit orientation instructions.     Will continue to evaluate and treat per MD orders.

## 2015-10-17 NOTE — Progress Notes (Signed)
Patient ID: Rachel Hess, female   DOB: 01-26-1951, 63 y.o.   MRN: [id number] Doing well, no complaints aside from neck soreness. AF VSS Alert, NAD, normal voice. Neck incision clean and intact, no fluid collection, drain functioning. A/P: s/p left thyroid lobectomy Observe overnight with drain in place.

## 2015-10-17 NOTE — Anesthesia Procedure Notes (Signed)
Procedure Name: Intubation Date/Time: 10/17/2015 7:38 AM Performed by: Terrill Mohr Pre-anesthesia Checklist: Patient identified, Emergency Drugs available, Suction available and Patient being monitored Patient Re-evaluated:Patient Re-evaluated prior to inductionOxygen Delivery Method: Circle system utilized Preoxygenation: Pre-oxygenation with 100% oxygen Intubation Type: IV induction Ventilation: Mask ventilation without difficulty Laryngoscope Size: Mac and 3 Grade View: Grade II Tube type: Oral (NIMS ETT) Tube size: 7.0 mm Number of attempts: 1 Airway Equipment and Method: Stylet Placement Confirmation: positive ETCO2,  ETT inserted through vocal cords under direct vision and breath sounds checked- equal and bilateral ETT to lip (cm): blue band between cords;  trachea deviated to right 3/4 inch. Tube secured with: Tape Dental Injury: Teeth and Oropharynx as per pre-operative assessment

## 2015-10-17 NOTE — Progress Notes (Signed)
Patient refuses CPAP therapy for the night due to nausea.  Patient will notify if she decides to wear.

## 2015-10-17 NOTE — Anesthesia Preprocedure Evaluation (Addendum)
Anesthesia Evaluation  Patient identified by MRN, date of birth, ID band Patient awake    Reviewed: Allergy & Precautions, NPO status , Patient's Chart, lab work & pertinent test results, reviewed documented beta blocker date and time   Airway Mallampati: II  TM Distance: >3 FB Neck ROM: Full    Dental  (+) Teeth Intact, Caps, Dental Advisory Given   Pulmonary sleep apnea and Continuous Positive Airway Pressure Ventilation , former smoker,  Brought mask    Pulmonary exam normal        Cardiovascular Normal cardiovascular exam     Neuro/Psych    GI/Hepatic   Endo/Other    Renal/GU      Musculoskeletal  (+) Fibromyalgia -  Abdominal   Peds  Hematology   Anesthesia Other Findings Surgeon's note that trachea is deviated to the right noted.  Reproductive/Obstetrics                           Anesthesia Physical Anesthesia Plan  ASA: II  Anesthesia Plan: General   Post-op Pain Management:    Induction: Intravenous  Airway Management Planned: Oral ETT  Additional Equipment:   Intra-op Plan:   Post-operative Plan: Extubation in OR  Informed Consent: I have reviewed the patients History and Physical, chart, labs and discussed the procedure including the risks, benefits and alternatives for the proposed anesthesia with the patient or authorized representative who has indicated his/her understanding and acceptance.     Plan Discussed with: CRNA and Surgeon  Anesthesia Plan Comments:        Anesthesia Quick Evaluation

## 2015-10-17 NOTE — Progress Notes (Signed)
Patient wearing hospital provided CPAP machine using her nasal pillows from home.  Home settings unknown, pt is on auto titrate mode min 6, max 20 with 2L 02 bleed in.  Patient tolerated well and is familiar with equipment and procedure.

## 2015-10-18 DIAGNOSIS — D34 Benign neoplasm of thyroid gland: Secondary | ICD-10-CM | POA: Diagnosis not present

## 2015-10-18 NOTE — Discharge Summary (Signed)
Physician Discharge Summary  Patient ID: Rachel Hess MRN: [id number] DOB/AGE: 01-27-1951 65 y.o.  Admit date: 10/17/2015 Discharge date: 10/18/2015  Admission Diagnoses: Thyromegaly  Discharge Diagnoses:  Principal Problem:   Thyromegaly   Discharged Condition: good  Hospital Course: 64 year old female presented for left thyroid lobectomy.  See operative note.  She was observed in the hospital overnight after surgery and did well with some pain requiring pain medication but no other problems.  On POD 1, she still has pain but is breathing well and swallowing well.  She is felt stable for discharge home.  The drain was removed.  Consults: None  Significant Diagnostic Studies: None  Treatments: surgery: Left thyroid lobectomy  Discharge Exam: Blood pressure 136/70, pulse 65, temperature 98.4 F (36.9 C), temperature source Oral, resp. rate 16, height 5\' 6"  (1.676 m), weight 106.624 kg (235 lb 1 oz), SpO2 98 %. General appearance: alert, cooperative, no distress and appears somewhat uncomfortable Neck: thyroid incision clean and intact, no fluid collection, drain removed, normal voice.  Disposition:   Discharge Instructions    Diet - low sodium heart healthy    Complete by:  As directed      Diet - low sodium heart healthy    Complete by:  As directed      Diet - low sodium heart healthy    Complete by:  As directed      Discharge instructions    Complete by:  As directed   1. Limited activity 2. Liquid and soft diet, advance as tolerated 3. May bathe and shower day after surgery 4. Wound care - Gentle cleaning with soap and water 5. DO NOT APPLY ANY OINTMENT 6. Elevate Head of Bed     Discharge instructions    Complete by:  As directed   No strenuous activity.  Normal diet.  OK to allow incision to get wet, gently pat dry.  Do not apply ointment to incision.  Call with signs of infection including marked swelling, redness, pus drainage, or fever.     Increase  activity slowly    Complete by:  As directed      Increase activity slowly    Complete by:  As directed      Increase activity slowly    Complete by:  As directed             Medication List    TAKE these medications        fexofenadine-pseudoephedrine 180-240 MG 24 hr tablet  Commonly known as:  ALLEGRA-D 24  Take 1 tablet by mouth daily as needed.     HYDROcodone-acetaminophen 5-325 MG tablet  Commonly known as:  NORCO  Take 1-2 tablets by mouth every 6 (six) hours as needed.     hydrOXYzine 50 MG capsule  Commonly known as:  VISTARIL  Take 50 mg by mouth every 6 (six) hours as needed for anxiety.     ibuprofen 800 MG tablet  Commonly known as:  ADVIL,MOTRIN  Take 800 mg by mouth every 8 (eight) hours as needed for mild pain or moderate pain.           Follow-up Information    Follow up with SHOEMAKER, DAVID, MD. Schedule an appointment as soon as possible for a visit in 10 days.   Specialty:  Otolaryngology   Why:  For wound re-check   Contact information:   [address] [place] [address] [phone]  SignedRedmond Baseman, Katena Petitjean 10/18/2015, 9:28 AM

## 2015-10-18 NOTE — Op Note (Signed)
NAMECHENELL, DULONG NO.:  [id number]  MEDICAL RECORD NO.:  [id number]  LOCATION:  6N08C                        FACILITY:  [facility]  PHYSICIAN:  Early Chars. Wilburn Cornelia, M.D.DATE OF BIRTH:  [birth year]  DATE OF PROCEDURE:  10/17/2015 DATE OF DISCHARGE:                              OPERATIVE REPORT   LOCATION:  [facility] Main OR.  PREOPERATIVE DIAGNOSES: 1. Multinodular goiter. 2. Significant thyromegaly of the left thyroid gland with tracheal     compression.  POSTOPERATIVE DIAGNOSES: 1. Multinodular goiter. 2. Significant thyromegaly of the left thyroid gland with tracheal     compression.  ANESTHESIA:  General endotracheal with intraoperative NIMS monitoring.  SURGEON:  Early Chars. Wilburn Cornelia, M.D.  ASSISTANT:  Marikay Alar. Erik Obey, M.D.  COMPLICATIONS:  No complications.  ESTIMATED BLOOD LOSS:  150 mL.  DISPOSITION:  The patient was transferred from the operating room to the recovery room in stable condition.  BRIEF HISTORY:  The patient is an otherwise healthy 64 year old black female, who is referred to our office for enlarging mass involving the anterior neck.  The patient had undergone ultrasound evaluation, which showed bilateral changes in the thyroid gland consistent with multinodular goiter.  The left side shows significant thyromegaly and measured more than 8 cm in dimension with compression of the trachea and deviation of the trachea to the right.  The patient had normal voice and normal vocal cord function in the preoperative examination.  The patient underwent ultrasound-guided needle biopsy of an isolated nodule in the left hand side, which was benign.  With those findings, I recommended left thyroidectomy to remove the left thyroid lobe and decompress the trachea.  The right thyroid lobe was not significantly enlarged and we opted to perform a hemithyroidectomy.  The risk and benefits of the procedure were discussed in detail with the  patient and her family, they understood and agreed with our plan for surgery, which was scheduled on elective basis at [place] under general anesthesia.  DESCRIPTION OF PROCEDURE:  The patient was brought to the operating room on October 17, 2015, and placed in supine position on the operating table.  General endotracheal anesthesia was established without difficulty.  A Xomed NIMS endotracheal tube was used for intraoperative monitoring of the recurrent laryngeal nerve.  The NIMS tube was placed properly and there was correct external stimulation.  A surgical time- out was then performed with correct identification of the patient and the surgical procedure including left laterality of the thyroidectomy. The patient's skin was then injected with a total of 4 mL of 1% lidocaine with 1:100,000 solution of epinephrine, which was injected in subcutaneous fashion in the anterior neck at the proposed skin incision. The patient was positioned, prepped and draped, and the surgical procedure was begun.  Using #15 scalpel, a 6-cm curvilinear horizontally-oriented incision was created in a preexisting skin crease in the anterior neck.  This was carried through the skin and underlying subcutaneous tissue. Subcutaneous fat was divided.  The anterior strap muscles were identified and divided longitudinally and then lateralized allowing access to the anterior compartment of the neck.  In order to maintain maximal exposure, subplatysmal flaps were then elevated and a Mahorner  retractor was placed to increase exposure.  The anterior compartment of the neck was then carefully palpated.  The patient had a very large mass in the anterior neck consistent with left thyromegaly extending across the midline and involving the isthmus of the thyroid gland.  The cricoid cartilage and trachea were then carefully palpated and the superior and inferior aspects of the thyroid isthmus were  identified.  Using Bovie electrocautery and Harmonic scalpel, the thyroid isthmus was divided. The right thyroid lobe was palpated, there was minimal nodularity.  It was not significantly enlarged.  No further dissection was undertaken on the right.  The right thyroid isthmus was oversewn with interrupted 2-0 chromic suture.  The left thyroid isthmus and left thyroid lobe were then carefully elevated and dissection along the anterior trachea and Barry's ligament was undertaken.  The strap muscles were then completely lateralized and the sternocleidomastoid muscle was elevated to allow access to the left lateral neck.  Using blunt and sharp dissection, the thyroid lobe was carefully dissected.  Vascular contributions to the gland were divided with combination of the Harmonic scalpel and suture ligature.  The thyroid lobe was then carefully mobilized from the deep aspect of the neck including the upper mediastinum and elevated into the incision.  The residual attachments were then suture ligated and the entire left thyroid lobe and isthmus were removed and sent to Pathology for gross microscopic evaluation.  The patient's wound was carefully irrigated and individual bleeding sources were suture ligated as well as small sources treated with bipolar cautery.  A Valsalva maneuver was then performed via the endotracheal tube and there was no significant active bleeding.  The recurrent laryngeal nerve was identified.  This was stimulated with the NIMS monitor and was intact at the conclusion of the surgical procedure.  The wound was carefully irrigated and cleared. There was no active bleeding.  A 10-French round drain was then placed at the depth of the incision and was carried to the primary incision and sutured to the neck with 3-0 Ethilon suture.  The wound was then closed in multiple layers.  The strap muscles were reapproximated in the midline with a single 3-0 Vicryl suture.  The platysmal  flaps were then reapproximated with interrupted 3-0 Vicryl, deep subcutaneous tissue was closed with interrupted 4-0 Vicryl and final skin edge closure with Dermabond surgical glue.  The patient was then awakened from anesthetic. She was extubated and transferred from the operating room to the recovery room in stable condition.  There were no complications and blood loss was 150 mL.          ______________________________ Early Chars. Wilburn Cornelia, M.D.     DLS/MEDQ  D:  [id number]  T:  10/18/2015  Job:  [id number]

## 2015-10-20 ENCOUNTER — Encounter (HOSPITAL_COMMUNITY): Payer: Self-pay | Admitting: Otolaryngology

## 2015-12-08 IMAGING — CR DG CHEST 2V
2 series · 2 of 2 positions shown · non-contrast
Comparison: PA and lateral chest x-ray February 27, 2015

CLINICAL DATA: Preoperative exam prior to thyroid surgery,
asymptomatic, ex-smoker.

EXAM:
CHEST  2 VIEW

[w chest pa]
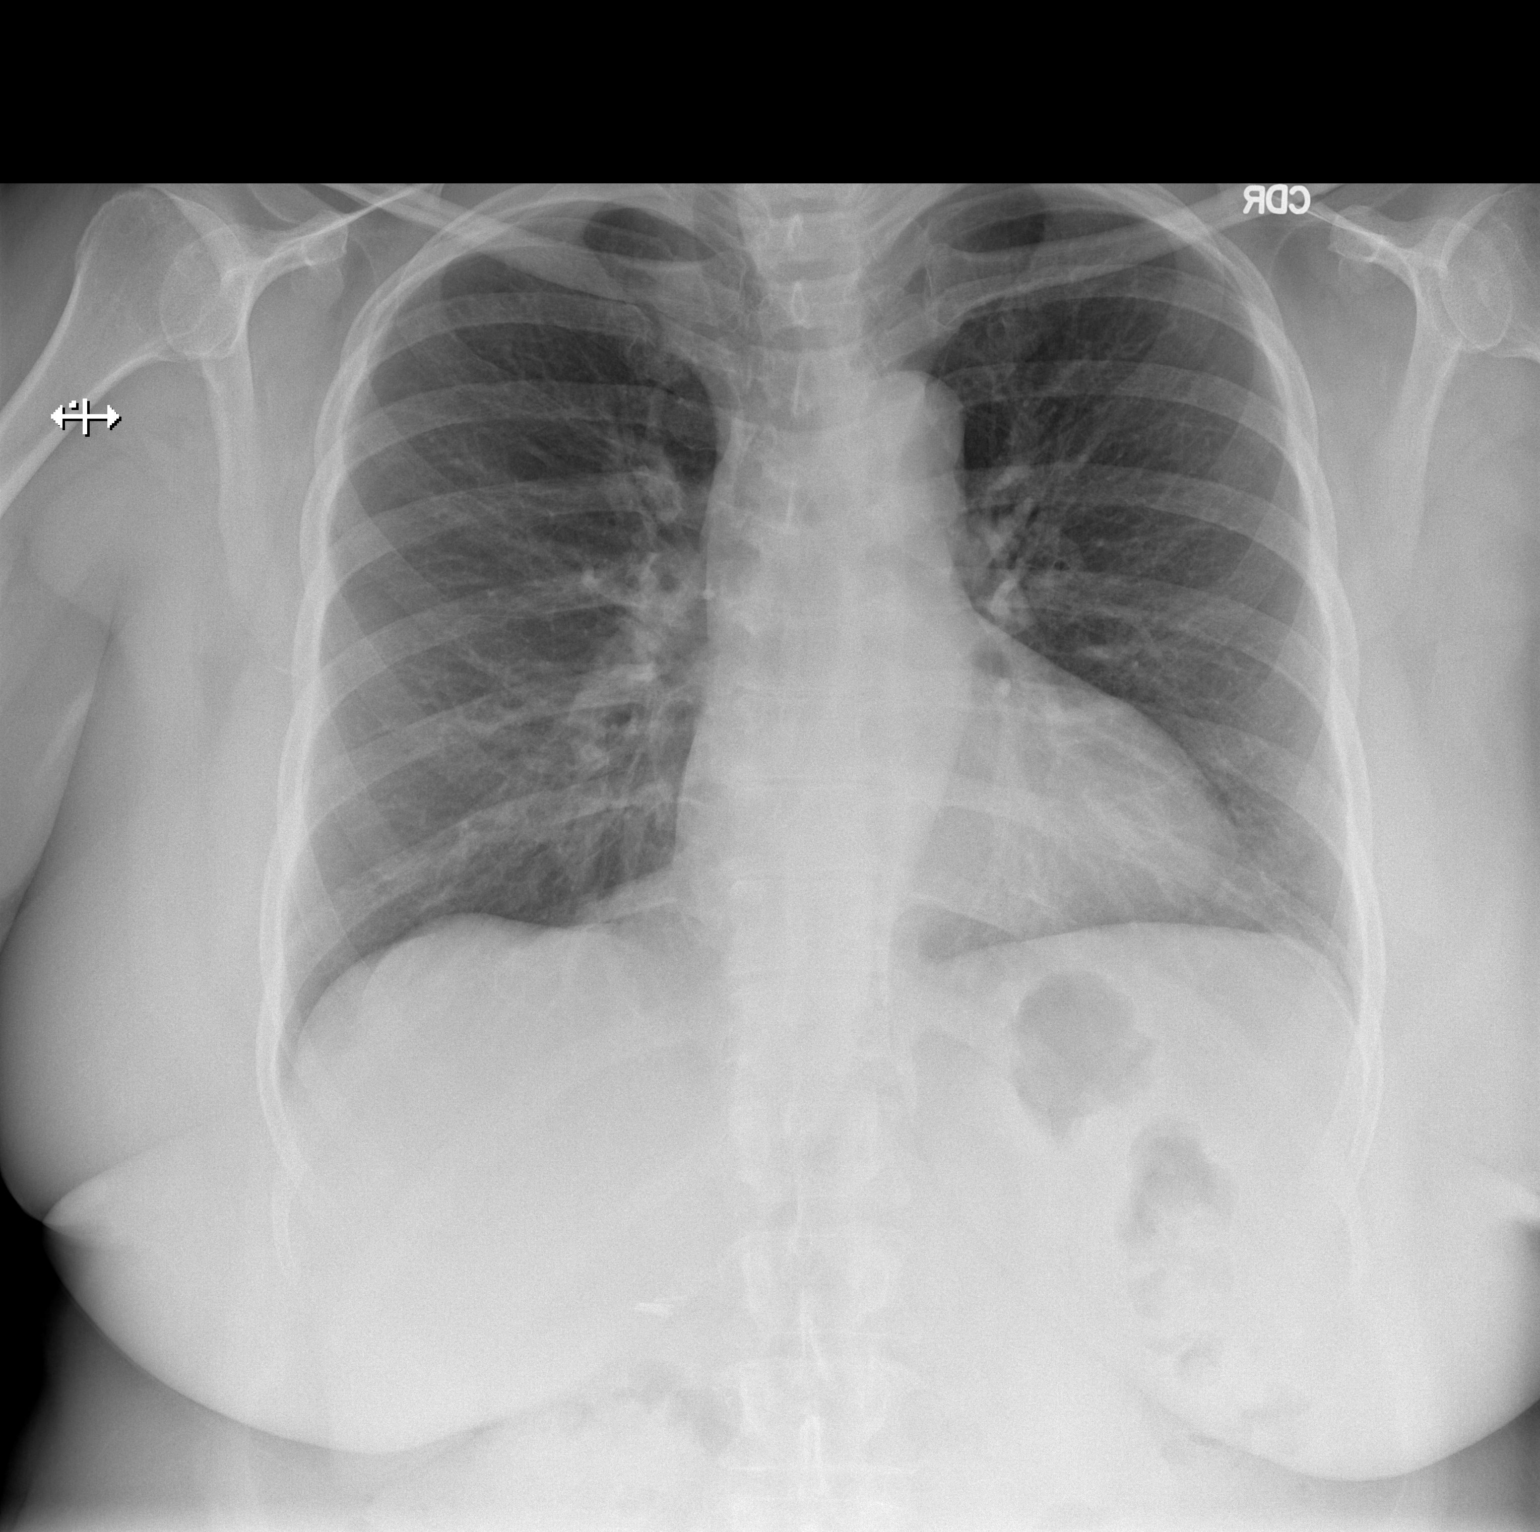

[w chest lat]
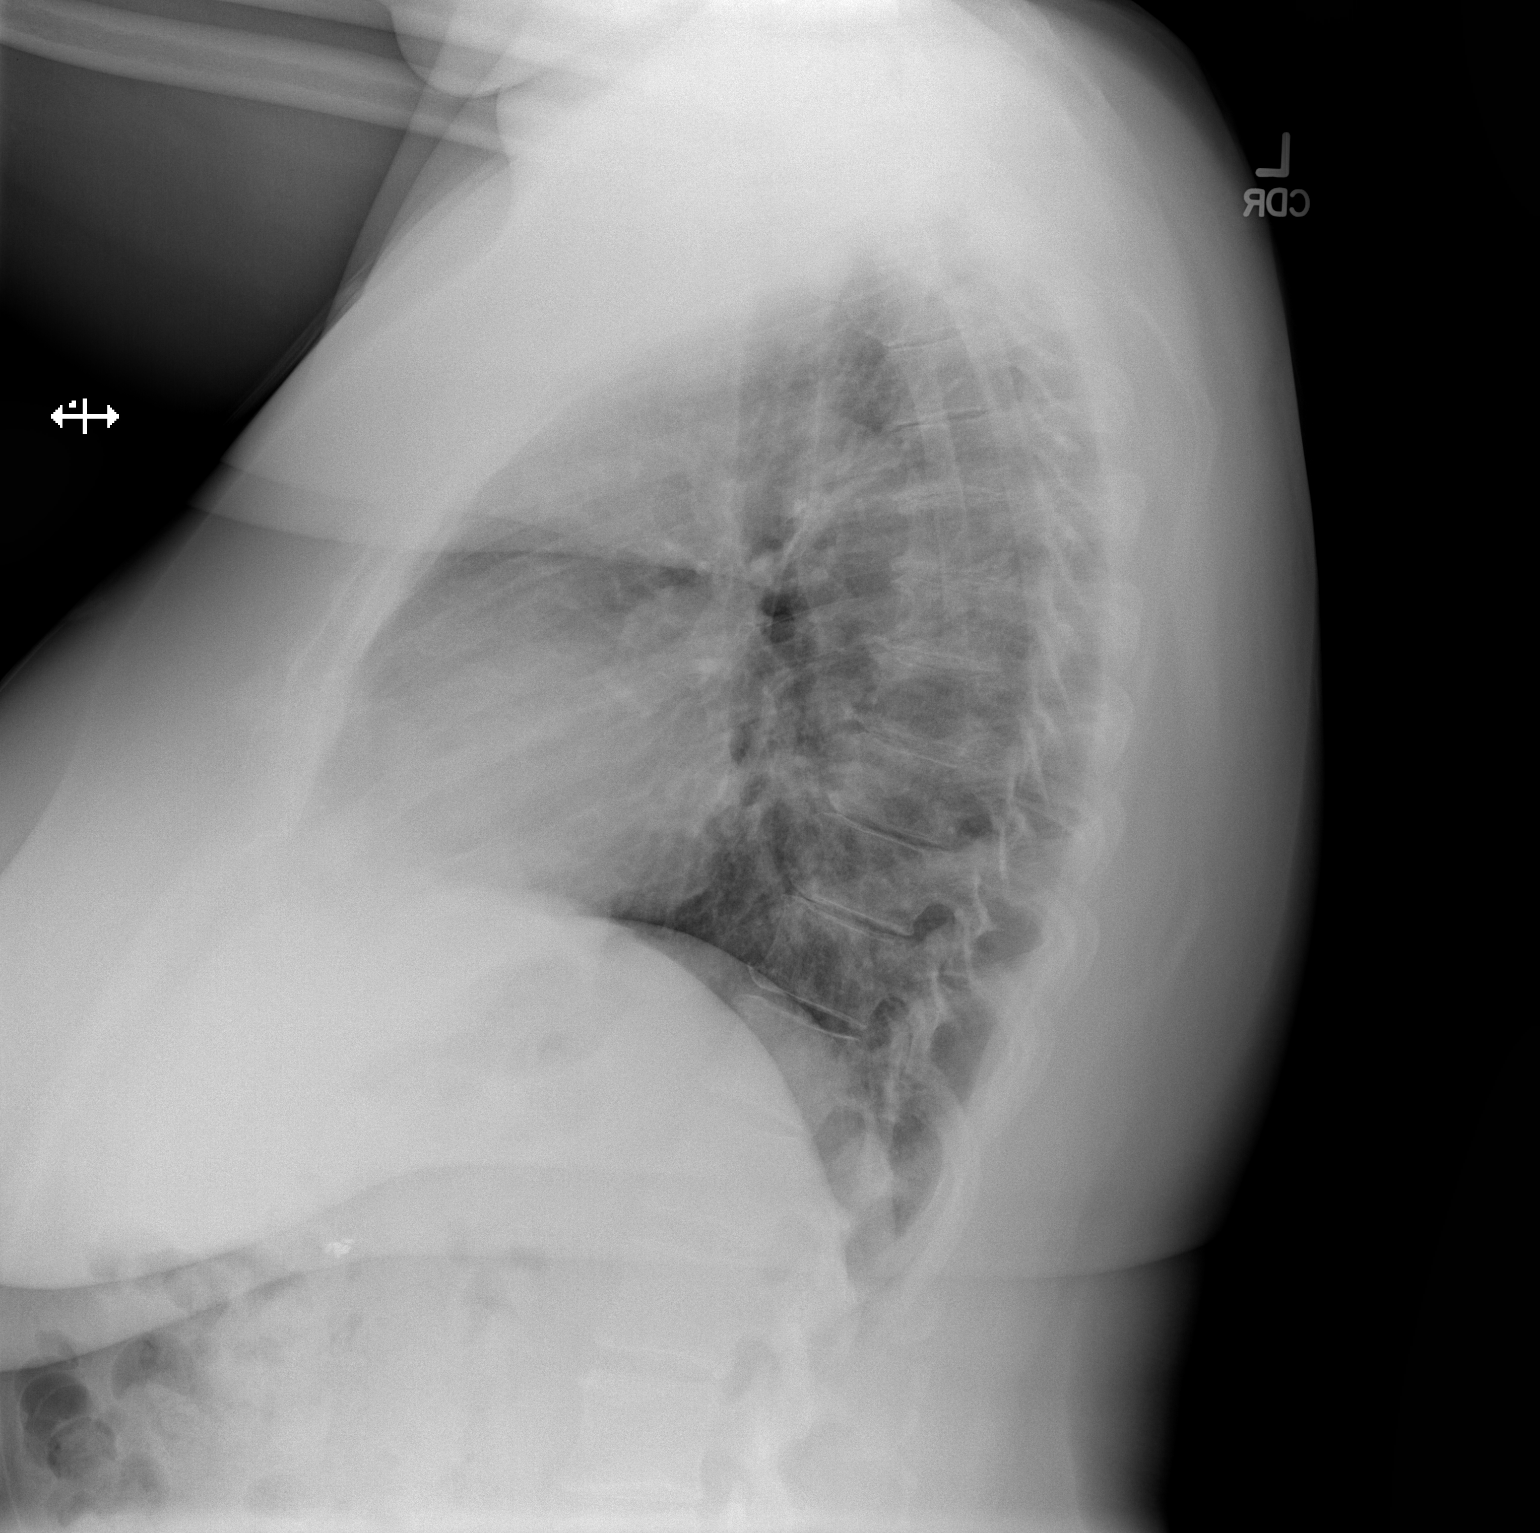

[2 of 2 positions shown; findings below may reference images not displayed]

FINDINGS: There is persistent deviation of the trachea toward the right by the
known goiter. The lungs are adequately inflated. The interstitial
markings are mildly prominent but not greatly changed from the
previous study. The heart is top-normal in size. The pulmonary
vascularity is normal. There is no pleural effusion. There is
multilevel degenerative disc space narrowing of the thoracic spine.
IMPRESSION: 1. Mild stable interstitial prominence in both lungs likely
reflecting chronic bronchitic change in the previous smoking
history. There is no acute cardiopulmonary abnormality.
2. Stable deviation of the trachea toward the right by the known
enlarged thyroid gland.

## 2016-03-02 ENCOUNTER — Other Ambulatory Visit: Payer: Self-pay

## 2016-03-02 DIAGNOSIS — Z1231 Encounter for screening mammogram for malignant neoplasm of breast: Secondary | ICD-10-CM

## 2016-04-13 ENCOUNTER — Ambulatory Visit: Payer: Medicare Other

## 2016-05-07 ENCOUNTER — Ambulatory Visit
Admission: RE | Admit: 2016-05-07 | Discharge: 2016-05-07 | Disposition: A | Discharge status: Home / Self Care | Payer: Medicare Other | Source: Ambulatory Visit

## 2016-05-07 DIAGNOSIS — Z1231 Encounter for screening mammogram for malignant neoplasm of breast: Secondary | ICD-10-CM

## 2016-08-20 DIAGNOSIS — R635 Abnormal weight gain: Secondary | ICD-10-CM | POA: Diagnosis not present

## 2017-04-18 ENCOUNTER — Other Ambulatory Visit: Payer: Self-pay | Admitting: Family

## 2017-04-18 DIAGNOSIS — Z1231 Encounter for screening mammogram for malignant neoplasm of breast: Secondary | ICD-10-CM

## 2017-05-09 ENCOUNTER — Ambulatory Visit
Admission: RE | Admit: 2017-05-09 | Discharge: 2017-05-09 | Disposition: A | Discharge status: Home / Self Care | Payer: Medicare Other | Source: Ambulatory Visit | Attending: Family | Admitting: Family

## 2017-05-09 DIAGNOSIS — Z1231 Encounter for screening mammogram for malignant neoplasm of breast: Secondary | ICD-10-CM

## 2018-02-15 NOTE — Progress Notes (Signed)
Office Visit Note  Patient: Rachel Hess             Date of Birth: [birth year]           MRN: [id number]             PCP: Nanci Pina, FNP Referring: Gaye Alken, PA-C Visit Date: 02/28/2018 Occupation: @GUAROCC @    Subjective:  New Patient (Initial Visit) (bil lat hand swelling and numbness, having pain all over shouldrs,knees, at times painful to the touch)   History of Present Illness: Rachel Hess is a 67 y.o. female returns today after her last visit in September 2013.  At that time patient presented with 1 year history of generalized pain fatigue and arthralgias.  She has had extensive evaluation by her PCP all her labs were negative for autoimmune disease.  We obtained x-rays in the office at that time which were consistent with osteoarthritis in her hands mild spondylosis in her C-spine and lumbar spine.  She was also diagnosed with vitamin D deficiency and fibromyalgia syndrome.  She was advised to start on Cymbalta and Robaxin through her PCP.  She was also referred to physical therapy and water therapy.  Patient recalls going to physical therapy after that visit.  Over this time she has been seen by her PCP.  She states she has been treated with anti-inflammatories which are not very helpful.  She continues to have generalized pain discomfort and pain in multiple joints.  Activities of Daily Living:  Patient reports morning stiffness for 1 hour.   Patient Denies nocturnal pain.  Difficulty dressing/grooming: Reports Difficulty climbing stairs: Reports Difficulty getting out of chair: Reports Difficulty using hands for taps, buttons, cutlery, and/or writing: Denies   Review of Systems  Constitutional: Positive for fatigue. Negative for night sweats, weight gain and weight loss.  HENT: Negative for ear pain, mouth sores, trouble swallowing, trouble swallowing, mouth dryness and nose dryness.   Eyes: Positive for pain. Negative for redness, visual  disturbance and dryness.  Respiratory: Negative for cough, shortness of breath and difficulty breathing.   Cardiovascular: Positive for swelling in legs/feet. Negative for chest pain, palpitations, hypertension and irregular heartbeat.  Gastrointestinal: Positive for constipation. Negative for blood in stool and diarrhea.  Endocrine: Negative for increased urination.  Genitourinary: Negative for difficulty urinating and vaginal dryness.  Musculoskeletal: Positive for arthralgias, joint pain, myalgias, morning stiffness and myalgias. Negative for joint swelling, muscle weakness and muscle tenderness.  Skin: Negative for color change, rash, hair loss, skin tightness, ulcers and sensitivity to sunlight.  Allergic/Immunologic: Negative for susceptible to infections.  Neurological: Negative for dizziness, numbness, memory loss, night sweats and weakness.  Hematological: Negative for bruising/bleeding tendency and swollen glands.  Psychiatric/Behavioral: Negative for depressed mood and sleep disturbance. The patient is not nervous/anxious.     PMFS History:  Patient Active Problem List   Diagnosis Date Noted  . Primary osteoarthritis of both hands 02/28/2018  . Thyromegaly 10/17/2015    Class: Chronic  . Diarrhea 03/12/2014  . Abdominal discomfort 03/12/2014  . Nausea alone 03/12/2014  . Dehydration, mild 03/12/2014  . Observed sleep apnea 01/16/2014  . Prediabetes 01/16/2014  . Edema 01/16/2014  . Lower extremity edema 12/05/2013  . Weight gain 12/05/2013  . Vaginal dryness 12/05/2013  . Chronic constipation 12/05/2013  . Neck fullness 12/05/2013    Past Medical History:  Diagnosis Date  . Allergy    "sinuses"  . Constipation   . Fibromyalgia   .  Goiter   . Hyperlipemia   . Morbid obesity ([place])   . OSA on CPAP   . Seasonal allergies   . Vitamin D deficiency     Family History  Problem Relation Age of Onset  . Heart disease Mother   . Cancer Father    Past Surgical  History:  Procedure Laterality Date  . CARPAL TUNNEL RELEASE Left   . CHOLECYSTECTOMY OPEN    . COLONOSCOPY W/ POLYPECTOMY    . THYROID LOBECTOMY Left 10/17/2015  . THYROIDECTOMY Left 10/17/2015   Procedure: LEFT THYROIDECTOMY ;  Surgeon: Jerrell Belfast, MD;  Location: [facility];  Service: ENT;  Laterality: Left;  . TONSILLECTOMY    . TUBAL LIGATION    . VAGINAL HYSTERECTOMY     Social History   Social History Narrative   Drinks about 1 cup of coffee a day      Objective: Vital Signs: BP 113/78 (BP Location: Left Arm, Patient Position: Sitting, Cuff Size: Large)   Pulse 68   Resp 18   Ht 5\' 5"  (1.651 m)   Wt 248 lb (112.5 kg)   BMI 41.27 kg/m    Physical Exam  Constitutional: She is oriented to person, place, and time. She appears well-developed and well-nourished.  HENT:  Head: Normocephalic and atraumatic.  Eyes: Conjunctivae and EOM are normal.  Neck: Normal range of motion.  Cardiovascular: Normal rate, regular rhythm, normal heart sounds and intact distal pulses.  Pulmonary/Chest: Effort normal and breath sounds normal.  Abdominal: Soft. Bowel sounds are normal.  Lymphadenopathy:    She has no cervical adenopathy.  Neurological: She is alert and oriented to person, place, and time.  Skin: Skin is warm and dry. Capillary refill takes less than 2 seconds.  Psychiatric: She has a normal mood and affect. Her behavior is normal.  Nursing note and vitals reviewed.    Musculoskeletal Exam: C-spine thoracic lumbar spine good range of motion.  Shoulder joints elbow joints wrist joints with good range of motion.  She has some DIP and CMC thickening bilaterally.  She has good range of motion of her hip joints she has crepitus with range of motion of bilateral knee joints.  She is some tenderness over her left ankle joint without any warmth swelling or effusion.  She has some generalized hyperalgesia with positive tender points. CDAI Exam: No CDAI exam completed.     Investigation: No additional findings.  uric acid 5.6, sed rate 43, ANA -, RF - CBC Latest Ref Rng & Units 10/17/2015 10/13/2015 12/19/2013  WBC 4.0 - 10.5 K/uL 14.2(H) 8.8 7.8  Hemoglobin 12.0 - 15.0 g/dL 12.1 13.9 13.9  Hematocrit 36.0 - 46.0 % 36.8 42.0 40.3  Platelets 150 - 400 K/uL 259 273 302   CMP Latest Ref Rng & Units 10/17/2015 10/13/2015 03/12/2014  Glucose 65 - 99 mg/dL - 108(H) 78  BUN 6 - 20 mg/dL - 11 15  Creatinine 0.44 - 1.00 mg/dL 0.71 0.72 0.71  Sodium 135 - 145 mmol/L - 141 139  Potassium 3.5 - 5.1 mmol/L - 4.1 4.1  Chloride 101 - 111 mmol/L - 108 106  CO2 22 - 32 mmol/L - 26 23  Calcium 8.9 - 10.3 mg/dL - 9.5 9.0  Total Protein 6.0 - 8.3 g/dL - - 6.9  Total Bilirubin 0.2 - 1.2 mg/dL - - 0.3  Alkaline Phos 39 - 117 U/L - - 91  AST 0 - 37 U/L - - 21  ALT 0 - 35 U/L - -  24    Imaging: Xr Ankle 2 Views Left  Result Date: 02/28/2018 No subtalar or tibiotalar joint space narrowing was noted.  A calcaneal spur was noted. Unremarkable x-ray of the ankle joint.  Xr Hand 2 View Left  Result Date: 02/28/2018 PIP and DIP narrowing was noted.  CMC narrowing was noted.  No MCP intercarpal radiocarpal joint space narrowing was noted. Impression: These findings are consistent with osteoarthritis of the hand.  Xr Hand 2 View Right  Result Date: 02/28/2018 PIP and DIP narrowing was noted.  CMC narrowing was noted.  No MCP intercarpal radiocarpal joint space narrowing was noted. Impression: These findings are consistent with osteoarthritis of the hand.  Xr Knee 3 View Left  Result Date: 02/28/2018 Severe medial compartment narrowing was noted.  No chondrocalcinosis was noted.  Moderate to severe patellofemoral narrowing was noted. Impression: These findings are consistent with severe osteoarthritis and chondromalacia patella of the knee joint.  Xr Knee 3 View Right  Result Date: 02/28/2018 Moderate medial compartment narrowing was noted.  No chondrocalcinosis was noted.   Moderate patellofemoral narrowing was noted. Impression: These findings are consistent with moderate osteoarthritis and moderate chondromalacia patella.   Speciality Comments: No specialty comments available.    Procedures:  No procedures performed Allergies: Patient has no known allergies.   Assessment / Plan:     Visit Diagnoses: Polyarthralgia - on Tramadol and Mobic 15 mg daily ,uric acid 5.6, sed rate 43, ANA -, RF -.  Patient has no synovitis on examination.  Primary osteoarthritis of both hands -she has mild DIP thickening and CMC discomfort.  Plan: XR Hand 2 View Right, XR Hand 2 View Left.  The hands x-rays were consistent with osteoarthritis especially with CMC involvement.  We discussed the option of CMC brace.  A handout on hand exercises was given.  A prescription for bilateral CMC brace was given.  Chronic pain of both knees -she continues to have a lot of discomfort in her knee joints with crepitus in her bilateral knees.  Plan: XR KNEE 3 VIEW RIGHT, XR KNEE 3 VIEW LEFT left knee shows severe medial compartment narrowing and severe chondromalacia patella.  Right knee joint shows moderate osteoarthritis and moderate chondromalacia patella.  We discussed the option of Visco supplement injection and also total knee replacement for the left knee joint.  A handout on knee joint exercises was given.  She declined Visco supplement injections and total knee replacement at this time.  A prescription for Voltaren gel was given which can be used topically.  Side effects were discussed.  Pain in left ankle and joints of left foot -she gives history of intermittent swelling in her left ankle joint.  I did not appreciate any synovitis on examination.  Plan: XR Ankle 2 Views.  Her ankle x-ray was unremarkable.  DDD (degenerative disc disease), cervical: She has minimal discomfort with range of motion of her C-spine.  DDD (degenerative disc disease), lumbar she has good range of motion of her  lumbar spine.  Vitamin D deficiency: Patient reports taking vitamin D supplement. History of sleep apnea  Other fatigue  Chronic GERD  Fibromyalgia: She has been diagnosed with fibromyalgia in the past she still has some generalized pain and positive tender points.  She may benefit from use of Cymbalta and muscle relaxers.  I advised her to discuss this further with her PCP.  Leg edema  Hypercholesterolemia  Chronic constipation - on Linzess     Orders: Orders Placed This Encounter  Procedures  . XR Hand 2 View Right  . XR Hand 2 View Left  . XR KNEE 3 VIEW RIGHT  . XR KNEE 3 VIEW LEFT  . XR Ankle 2 Views Left   Meds ordered this encounter  Medications  . diclofenac sodium (VOLTAREN) 1 % GEL    Sig: Apply three grams to three large joints up to three times a day    Dispense:  3 Tube    Refill:  3    Face-to-face time spent with patient was 50 minutes.  Greater than 50% of time was spent in counseling and coordination of care.  Follow-Up Instructions: Return for OA, FMS, DDD.   Bo Merino, MD  Note - This record has been created using Editor, commissioning.  Chart creation errors have been sought, but may not always  have been located. Such creation errors do not reflect on  the standard of medical care.

## 2018-02-28 ENCOUNTER — Ambulatory Visit (INDEPENDENT_AMBULATORY_CARE_PROVIDER_SITE_OTHER): Payer: Medicare Other

## 2018-02-28 ENCOUNTER — Encounter: Payer: Self-pay | Admitting: Rheumatology

## 2018-02-28 ENCOUNTER — Ambulatory Visit (INDEPENDENT_AMBULATORY_CARE_PROVIDER_SITE_OTHER): Payer: Medicare Other | Admitting: Rheumatology

## 2018-02-28 ENCOUNTER — Ambulatory Visit (INDEPENDENT_AMBULATORY_CARE_PROVIDER_SITE_OTHER): Payer: Self-pay

## 2018-02-28 VITALS — BP 113/78 | HR 68 | Resp 18 | Ht 65.0 in | Wt 248.0 lb

## 2018-02-28 DIAGNOSIS — M51369 Other intervertebral disc degeneration, lumbar region without mention of lumbar back pain or lower extremity pain: Secondary | ICD-10-CM

## 2018-02-28 DIAGNOSIS — M5030 Other cervical disc degeneration, unspecified cervical region: Secondary | ICD-10-CM | POA: Diagnosis not present

## 2018-02-28 DIAGNOSIS — M25561 Pain in right knee: Secondary | ICD-10-CM

## 2018-02-28 DIAGNOSIS — M19042 Primary osteoarthritis, left hand: Secondary | ICD-10-CM | POA: Diagnosis not present

## 2018-02-28 DIAGNOSIS — Z8669 Personal history of other diseases of the nervous system and sense organs: Secondary | ICD-10-CM | POA: Diagnosis not present

## 2018-02-28 DIAGNOSIS — M5136 Other intervertebral disc degeneration, lumbar region: Secondary | ICD-10-CM | POA: Diagnosis not present

## 2018-02-28 DIAGNOSIS — K219 Gastro-esophageal reflux disease without esophagitis: Secondary | ICD-10-CM | POA: Diagnosis not present

## 2018-02-28 DIAGNOSIS — M25572 Pain in left ankle and joints of left foot: Secondary | ICD-10-CM | POA: Diagnosis not present

## 2018-02-28 DIAGNOSIS — E559 Vitamin D deficiency, unspecified: Secondary | ICD-10-CM | POA: Diagnosis not present

## 2018-02-28 DIAGNOSIS — M25562 Pain in left knee: Secondary | ICD-10-CM

## 2018-02-28 DIAGNOSIS — M797 Fibromyalgia: Secondary | ICD-10-CM

## 2018-02-28 DIAGNOSIS — R5383 Other fatigue: Secondary | ICD-10-CM

## 2018-02-28 DIAGNOSIS — R600 Localized edema: Secondary | ICD-10-CM

## 2018-02-28 DIAGNOSIS — M2550 Pain in unspecified joint: Secondary | ICD-10-CM | POA: Diagnosis not present

## 2018-02-28 DIAGNOSIS — M19041 Primary osteoarthritis, right hand: Secondary | ICD-10-CM | POA: Diagnosis not present

## 2018-02-28 DIAGNOSIS — M170 Bilateral primary osteoarthritis of knee: Secondary | ICD-10-CM

## 2018-02-28 DIAGNOSIS — G8929 Other chronic pain: Secondary | ICD-10-CM

## 2018-02-28 DIAGNOSIS — K5909 Other constipation: Secondary | ICD-10-CM

## 2018-02-28 DIAGNOSIS — E7800 Pure hypercholesterolemia, unspecified: Secondary | ICD-10-CM

## 2018-02-28 MED ORDER — DICLOFENAC SODIUM 1 % TD GEL: TRANSDERMAL | 3 refills | Status: DC

## 2018-02-28 NOTE — Patient Instructions (Addendum)
Hand Exercises Hand exercises can be helpful to almost anyone. These exercises can strengthen the hands, improve flexibility and movement, and increase blood flow to the hands. These results can make work and daily tasks easier. Hand exercises can be especially helpful for people who have joint pain from arthritis or have nerve damage from overuse (carpal tunnel syndrome). These exercises can also help people who have injured a hand. Most of these hand exercises are fairly gentle stretching routines. You can do them often throughout the day. Still, it is a good idea to ask your health care provider which exercises would be best for you. Warming your hands before exercise may help to reduce stiffness. You can do this with gentle massage or by placing your hands in warm water for 15 minutes. Also, make sure you pay attention to your level of hand pain as you begin an exercise routine. Exercises Knuckle Bend Repeat this exercise 5-10 times with each hand. 1. Stand or sit with your arm, hand, and all five fingers pointed straight up. Make sure your wrist is straight. 2. Gently and slowly bend your fingers down and inward until the tips of your fingers are touching the tops of your palm. 3. Hold this position for a few seconds. 4. Extend your fingers out to their original position, all pointing straight up again.  Finger Fan Repeat this exercise 5-10 times with each hand. 1. Hold your arm and hand out in front of you. Keep your wrist straight. 2. Squeeze your hand into a fist. 3. Hold this position for a few seconds. 4. Fan out, or spread apart, your hand and fingers as much as possible, stretching every joint fully.  Tabletop Repeat this exercise 5-10 times with each hand. 1. Stand or sit with your arm, hand, and all five fingers pointed straight up. Make sure your wrist is straight. 2. Gently and slowly bend your fingers at the knuckles where they meet the hand until your hand is making an  upside-down L shape. Your fingers should form a tabletop. 3. Hold this position for a few seconds. 4. Extend your fingers out to their original position, all pointing straight up again.  Making Os Repeat this exercise 5-10 times with each hand. 1. Stand or sit with your arm, hand, and all five fingers pointed straight up. Make sure your wrist is straight. 2. Make an O shape by touching your pointer finger to your thumb. Hold for a few seconds. Then open your hand wide. 3. Repeat this motion with each finger on your hand.  Table Spread Repeat this exercise 5-10 times with each hand. 1. Place your hand on a table with your palm facing down. Make sure your wrist is straight. 2. Spread your fingers out as much as possible. Hold this position for a few seconds. 3. Slide your fingers back together again. Hold for a few seconds.  Ball Grip  Repeat this exercise 10-15 times with each hand. 1. Hold a tennis ball or another soft ball in your hand. 2. While slowly increasing pressure, squeeze the ball as hard as possible. 3. Squeeze as hard as you can for 3-5 seconds. 4. Relax and repeat.  Wrist Curls Repeat this exercise 10-15 times with each hand. 1. Sit in a chair that has armrests. 2. Hold a light weight in your hand, such as a dumbbell that weighs 1-3 pounds (0.5-1.4 kg). Ask your health care provider what weight would be best for you. 3. Rest your hand just over   the end of the chair arm with your palm facing up. 4. Gently pivot your wrist up and down while holding the weight. Do not twist your wrist from side to side.  Contact a health care provider if:  Your hand pain or discomfort gets much worse when you do an exercise.  Your hand pain or discomfort does not improve within 2 hours after you exercise. If you have any of these problems, stop doing these exercises right away. Do not do them again unless your health care provider says that you can. Get help right away if:  You  develop sudden, severe hand pain. If this happens, stop doing these exercises right away. Do not do them again unless your health care provider says that you can. This information is not intended to replace advice given to you by your health care provider. Make sure you discuss any questions you have with your health care provider. Document Released: 10/13/2015 Document Revised: 04/08/2016 Document Reviewed: 05/12/2015 Elsevier Interactive Patient Education  2018 [employer] Knee Exercises Ask your health care provider which exercises are safe for you. Do exercises exactly as told by your health care provider and adjust them as directed. It is normal to feel mild stretching, pulling, tightness, or discomfort as you do these exercises, but you should stop right away if you feel sudden pain or your pain gets worse.Do not begin these exercises until told by your health care provider. STRETCHING AND RANGE OF MOTION EXERCISES These exercises warm up your muscles and joints and improve the movement and flexibility of your knee. These exercises also help to relieve pain, numbness, and tingling. Exercise A: Knee Extension, Prone 1. Lie on your abdomen on a bed. 2. Place your left / right knee just beyond the edge of the surface so your knee is not on the bed. You can put a towel under your left / right thigh just above your knee for comfort. 3. Relax your leg muscles and allow gravity to straighten your knee. You should feel a stretch behind your left / right knee. 4. Hold this position for __________ seconds. 5. Scoot up so your knee is supported between repetitions. Repeat __________ times. Complete this stretch __________ times a day. Exercise B: Knee Flexion, Active  1. Lie on your back with both knees straight. If this causes back discomfort, bend your left / right knee so your foot is flat on the floor. 2. Slowly slide your left / right heel back toward your buttocks until you feel a gentle  stretch in the front of your knee or thigh. 3. Hold this position for __________ seconds. 4. Slowly slide your left / right heel back to the starting position. Repeat __________ times. Complete this exercise __________ times a day. Exercise C: Quadriceps, Prone  1. Lie on your abdomen on a firm surface, such as a bed or padded floor. 2. Bend your left / right knee and hold your ankle. If you cannot reach your ankle or pant leg, loop a belt around your foot and grab the belt instead. 3. Gently pull your heel toward your buttocks. Your knee should not slide out to the side. You should feel a stretch in the front of your thigh and knee. 4. Hold this position for __________ seconds. Repeat __________ times. Complete this stretch __________ times a day. Exercise D: Hamstring, Supine 1. Lie on your back. 2. Loop a belt or towel over the ball of your left / right foot. The ball of   your foot is on the walking surface, right under your toes. 3. Straighten your left / right knee and slowly pull on the belt to raise your leg until you feel a gentle stretch behind your knee. ? Do not let your left / right knee bend while you do this. ? Keep your other leg flat on the floor. 4. Hold this position for __________ seconds. Repeat __________ times. Complete this stretch __________ times a day. STRENGTHENING EXERCISES These exercises build strength and endurance in your knee. Endurance is the ability to use your muscles for a long time, even after they get tired. Exercise E: Quadriceps, Isometric  1. Lie on your back with your left / right leg extended and your other knee bent. Put a rolled towel or small pillow under your knee if told by your health care provider. 2. Slowly tense the muscles in the front of your left / right thigh. You should see your kneecap slide up toward your hip or see increased dimpling just above the knee. This motion will push the back of the knee toward the floor. 3. For __________  seconds, keep the muscle as tight as you can without increasing your pain. 4. Relax the muscles slowly and completely. Repeat __________ times. Complete this exercise __________ times a day. Exercise F: Straight Leg Raises - Quadriceps 1. Lie on your back with your left / right leg extended and your other knee bent. 2. Tense the muscles in the front of your left / right thigh. You should see your kneecap slide up or see increased dimpling just above the knee. Your thigh may even shake a bit. 3. Keep these muscles tight as you raise your leg 4-6 inches (10-15 cm) off the floor. Do not let your knee bend. 4. Hold this position for __________ seconds. 5. Keep these muscles tense as you lower your leg. 6. Relax your muscles slowly and completely after each repetition. Repeat __________ times. Complete this exercise __________ times a day. Exercise G: Hamstring, Isometric 1. Lie on your back on a firm surface. 2. Bend your left / right knee approximately __________ degrees. 3. Dig your left / right heel into the surface as if you are trying to pull it toward your buttocks. Tighten the muscles in the back of your thighs to dig as hard as you can without increasing any pain. 4. Hold this position for __________ seconds. 5. Release the tension gradually and allow your muscles to relax completely for __________ seconds after each repetition. Repeat __________ times. Complete this exercise __________ times a day. Exercise H: Hamstring Curls  If told by your health care provider, do this exercise while wearing ankle weights. Begin with __________ weights. Then increase the weight by 1 lb (0.5 kg) increments. Do not wear ankle weights that are more than __________. 1. Lie on your abdomen with your legs straight. 2. Bend your left / right knee as far as you can without feeling pain. Keep your hips flat against the floor. 3. Hold this position for __________ seconds. 4. Slowly lower your leg to the  starting position.  Repeat __________ times. Complete this exercise __________ times a day. Exercise I: Squats (Quadriceps) 1. Stand in front of a table, with your feet and knees pointing straight ahead. You may rest your hands on the table for balance but not for support. 2. Slowly bend your knees and lower your hips like you are going to sit in a chair. ? Keep your weight over your heels,   not over your toes. ? Keep your lower legs upright so they are parallel with the table legs. ? Do not let your hips go lower than your knees. ? Do not bend lower than told by your health care provider. ? If your knee pain increases, do not bend as low. 3. Hold the squat position for __________ seconds. 4. Slowly push with your legs to return to standing. Do not use your hands to pull yourself to standing. Repeat __________ times. Complete this exercise __________ times a day. Exercise J: Wall Slides (Quadriceps)  1. Lean your back against a smooth wall or door while you walk your feet out 18-24 inches (46-61 cm) from it. 2. Place your feet hip-width apart. 3. Slowly slide down the wall or door until your knees bend __________ degrees. Keep your knees over your heels, not over your toes. Keep your knees in line with your hips. 4. Hold for __________ seconds. Repeat __________ times. Complete this exercise __________ times a day. Exercise K: Straight Leg Raises - Hip Abductors 1. Lie on your side with your left / right leg in the top position. Lie so your head, shoulder, knee, and hip line up. You may bend your bottom knee to help you keep your balance. 2. Roll your hips slightly forward so your hips are stacked directly over each other and your left / right knee is facing forward. 3. Leading with your heel, lift your top leg 4-6 inches (10-15 cm). You should feel the muscles in your outer hip lifting. ? Do not let your foot drift forward. ? Do not let your knee roll toward the ceiling. 4. Hold this  position for __________ seconds. 5. Slowly return your leg to the starting position. 6. Let your muscles relax completely after each repetition. Repeat __________ times. Complete this exercise __________ times a day. Exercise L: Straight Leg Raises - Hip Extensors 1. Lie on your abdomen on a firm surface. You can put a pillow under your hips if that is more comfortable. 2. Tense the muscles in your buttocks and lift your left / right leg about 4-6 inches (10-15 cm). Keep your knee straight as you lift your leg. 3. Hold this position for __________ seconds. 4. Slowly lower your leg to the starting position. 5. Let your leg relax completely after each repetition. Repeat __________ times. Complete this exercise __________ times a day. This information is not intended to replace advice given to you by your health care provider. Make sure you discuss any questions you have with your health care provider. Document Released: 09/15/2005 Document Revised: 07/26/2016 Document Reviewed: 09/07/2015 Elsevier Interactive Patient Education  2018 [employer].  Natural anti-inflammatories  You can purchase these at [employer], [employer] or online.  . Turmeric (capsules)  . Ginger (ginger root or capsules)  . Omega 3 (Fish, flax seeds, chia seeds, walnuts, almonds)  . Tart cherry (dried or extract)   Patient should be under the care of a physician while taking these supplements. This may not be reproduced without the permission of Dr. Bo Merino.

## 2018-03-24 DIAGNOSIS — Z8719 Personal history of other diseases of the digestive system: Secondary | ICD-10-CM | POA: Insufficient documentation

## 2018-03-24 DIAGNOSIS — M51369 Other intervertebral disc degeneration, lumbar region without mention of lumbar back pain or lower extremity pain: Secondary | ICD-10-CM | POA: Insufficient documentation

## 2018-03-24 DIAGNOSIS — M170 Bilateral primary osteoarthritis of knee: Secondary | ICD-10-CM | POA: Insufficient documentation

## 2018-03-24 DIAGNOSIS — M5030 Other cervical disc degeneration, unspecified cervical region: Secondary | ICD-10-CM | POA: Insufficient documentation

## 2018-03-24 DIAGNOSIS — M5136 Other intervertebral disc degeneration, lumbar region: Secondary | ICD-10-CM | POA: Insufficient documentation

## 2018-03-24 DIAGNOSIS — M797 Fibromyalgia: Secondary | ICD-10-CM | POA: Insufficient documentation

## 2018-03-24 DIAGNOSIS — E559 Vitamin D deficiency, unspecified: Secondary | ICD-10-CM | POA: Insufficient documentation

## 2018-03-24 DIAGNOSIS — E785 Hyperlipidemia, unspecified: Secondary | ICD-10-CM | POA: Insufficient documentation

## 2018-03-24 NOTE — Progress Notes (Signed)
Office Visit Note  Patient: Rachel Hess             Date of Birth: 27-Oct-1951           MRN: [id number]             PCP: Nanci Pina, FNP Referring: Nanci Pina, FNP Visit Date: 03/28/2018 Occupation: _0 @    Subjective:  Pian in hands and knees.   History of Present Illness: Rachel Hess is a 67 y.o. female with history of osteoarthritis and fibromyalgia.  She states she continues to have pain and discomfort in all of her muscles.  She has been using [place] brace which has been helpful to some extent.  She continues to have pain and discomfort in her bilateral knee joints.  Activities of Daily Living:  Patient reports morning stiffness for 15 Minutes.   Patient Denies nocturnal pain.  Difficulty dressing/grooming: Denies Difficulty climbing stairs: Reports Difficulty getting out of chair: Reports Difficulty using hands for taps, buttons, cutlery, and/or writing: Denies   Review of Systems  Constitutional: Negative for fatigue, night sweats, weight gain and weight loss.  HENT: Positive for mouth dryness. Negative for mouth sores, trouble swallowing, trouble swallowing and nose dryness.   Eyes: Positive for redness. Negative for pain, visual disturbance and dryness.  Respiratory: Negative for cough, hemoptysis, shortness of breath and difficulty breathing.   Cardiovascular: Negative for chest pain, palpitations, hypertension, irregular heartbeat and swelling in legs/feet.  Gastrointestinal: Positive for constipation. Negative for blood in stool and diarrhea.  Endocrine: Negative for increased urination.  Genitourinary: Negative for painful urination and vaginal dryness.  Musculoskeletal: Positive for arthralgias, joint pain, myalgias, morning stiffness, muscle tenderness and myalgias. Negative for joint swelling and muscle weakness.  Skin: Negative for color change, pallor, rash, hair loss, nodules/bumps, skin tightness, ulcers and sensitivity to  sunlight.  Allergic/Immunologic: Negative for susceptible to infections.  Neurological: Negative for dizziness, numbness, headaches, memory loss, night sweats and weakness.  Hematological: Negative for swollen glands.  Psychiatric/Behavioral: Negative for depressed mood and sleep disturbance. The patient is not nervous/anxious.     PMFS History:  Patient Active Problem List   Diagnosis Date Noted  . Primary osteoarthritis of both knees 03/24/2018  . DDD (degenerative disc disease), cervical 03/24/2018  . DDD (degenerative disc disease), lumbar 03/24/2018  . Fibromyalgia 03/24/2018  . Dyslipidemia 03/24/2018  . History of gastroesophageal reflux (GERD) 03/24/2018  . Vitamin D deficiency 03/24/2018  . Primary osteoarthritis of both hands 02/28/2018  . Thyromegaly 10/17/2015    Class: Chronic  . Diarrhea 03/12/2014  . Abdominal discomfort 03/12/2014  . Nausea alone 03/12/2014  . Dehydration, mild 03/12/2014  . Observed sleep apnea 01/16/2014  . Prediabetes 01/16/2014  . Edema 01/16/2014  . Lower extremity edema 12/05/2013  . Weight gain 12/05/2013  . Vaginal dryness 12/05/2013  . Chronic constipation 12/05/2013  . Neck fullness 12/05/2013    Past Medical History:  Diagnosis Date  . Allergy    "sinuses"  . Constipation   . Fibromyalgia   . Goiter   . Hyperlipemia   . Morbid obesity ([place])   . OSA on CPAP   . Seasonal allergies   . Vitamin D deficiency     Family History  Problem Relation Age of Onset  . Heart disease Mother   . Cancer Father    Past Surgical History:  Procedure Laterality Date  . CARPAL TUNNEL RELEASE Left   . CHOLECYSTECTOMY OPEN    . COLONOSCOPY  W/ POLYPECTOMY    . THYROID LOBECTOMY Left 10/17/2015  . THYROIDECTOMY Left 10/17/2015   Procedure: LEFT THYROIDECTOMY ;  Surgeon: Jerrell Belfast, MD;  Location: [place];  Service: ENT;  Laterality: Left;  . TONSILLECTOMY    . TUBAL LIGATION    . VAGINAL HYSTERECTOMY     Social History   Social  History Narrative   Drinks about 1 cup of coffee a day      Objective: Vital Signs: BP 118/74 (BP Location: Left Arm, Patient Position: Sitting, Cuff Size: Large)   Pulse 81   Resp 16   Ht _0  (1.651 m)   Wt 252 lb (114.3 kg)   BMI 41.93 kg/m    Physical Exam  Constitutional: She is oriented to person, place, and time. She appears well-developed and well-nourished.  HENT:  Head: Normocephalic and atraumatic.  Eyes: Conjunctivae and EOM are normal.  Neck: Normal range of motion.  Cardiovascular: Normal rate, regular rhythm, normal heart sounds and intact distal pulses.  Pulmonary/Chest: Effort normal and breath sounds normal.  Abdominal: Soft. Bowel sounds are normal.  Lymphadenopathy:    She has no cervical adenopathy.  Neurological: She is alert and oriented to person, place, and time.  Skin: Skin is warm and dry. Capillary refill takes less than 2 seconds.  Psychiatric: She has a normal mood and affect. Her behavior is normal.  Nursing note and vitals reviewed.    Musculoskeletal Exam: C-spine thoracic lumbar spine limited range of motion.  Shoulder joints elbow joints wrist joint MCPs PIPs DIPs were in good range of motion.  She has some bilateral CMC discomfort.  She is crepitus in her bilateral knee joints.  She had painful range of motion of her left knee joint.  Hip joints ankle joints MTPs were in good range of motion with no synovitis.  She has generalized hyperalgesia and positive tender points.   CDAI Exam: No CDAI exam completed.    Investigation: No additional findings.   Imaging: Xr Ankle 2 Views Left  Result Date: 02/28/2018 No subtalar or tibiotalar joint space narrowing was noted.  A calcaneal spur was noted. Unremarkable x-ray of the ankle joint.  Xr Hand 2 View Left  Result Date: 02/28/2018 PIP and DIP narrowing was noted.  CMC narrowing was noted.  No MCP intercarpal radiocarpal joint space narrowing was noted. Impression: These findings are  consistent with osteoarthritis of the hand.  Xr Hand 2 View Right  Result Date: 02/28/2018 PIP and DIP narrowing was noted.  CMC narrowing was noted.  No MCP intercarpal radiocarpal joint space narrowing was noted. Impression: These findings are consistent with osteoarthritis of the hand.  Xr Knee 3 View Left  Result Date: 02/28/2018 Severe medial compartment narrowing was noted.  No chondrocalcinosis was noted.  Moderate to severe patellofemoral narrowing was noted. Impression: These findings are consistent with severe osteoarthritis and chondromalacia patella of the knee joint.  Xr Knee 3 View Right  Result Date: 02/28/2018 Moderate medial compartment narrowing was noted.  No chondrocalcinosis was noted.  Moderate patellofemoral narrowing was noted. Impression: These findings are consistent with moderate osteoarthritis and moderate chondromalacia patella.   Speciality Comments: No specialty comments available.    Procedures:  No procedures performed Allergies: Patient has no known allergies.   Assessment / Plan:     Visit Diagnoses: Polyarthralgia - ANA, RF, ESR, uric acid were all normal.  Patient has been treated with tramadol and Mobic.  She had no synovitis on examination.  Primary osteoarthritis  of both hands - Bilateral CMC braces were prescribed during the last visit.  She has been using those.  Primary osteoarthritis of both knees - Right moderate, left severe medial compartment narrowing.  Bilateral moderate chondromalacia patella.I had detailed discussion with the patient regarding left total knee replacement as she has discomfort walking and problems with mobility.  After detailed discussion patient was in agreement.  I will refer her to orthopedics.  She has been using Voltaren gel which has been helpful to some extent.  Handout on knee joint exercises was given. Natural supplements were also discussed.  DDD (degenerative disc disease), cervical-she has limited range of  motion some discomfort.  DDD (degenerative disc disease), lumbar-chronic discomfort.  Fibromyalgia-she has ongoing pain and discomfort and positive tender points.  Observed sleep apnea  Dyslipidemia  History of gastroesophageal reflux (GERD)  Vitamin D deficiency  Class 3 severe obesity due to excess calories without serious comorbidity with body mass index (BMI) of 40.0 to 44.9 in adult [facility]).  Weight loss diet and exercise was discussed at length.   Orders: No orders of the defined types were placed in this encounter.  No orders of the defined types were placed in this encounter.   Face-to-face time spent with patient was 30 minutes. >50% of time was spent in counseling and coordination of care.  Follow-Up Instructions: Return in about 6 months (around 09/28/2018) for Osteoarthritis, DDD, FMS.   Bo Merino, MD  Note - This record has been created using Editor, commissioning.  Chart creation errors have been sought, but may not always  have been located. Such creation errors do not reflect on  the standard of medical care.

## 2018-03-28 ENCOUNTER — Encounter: Payer: Self-pay | Admitting: Rheumatology

## 2018-03-28 ENCOUNTER — Ambulatory Visit (INDEPENDENT_AMBULATORY_CARE_PROVIDER_SITE_OTHER): Payer: Medicare Other | Admitting: Rheumatology

## 2018-03-28 VITALS — BP 118/74 | HR 81 | Resp 16 | Ht 65.0 in | Wt 252.0 lb

## 2018-03-28 DIAGNOSIS — M19042 Primary osteoarthritis, left hand: Secondary | ICD-10-CM

## 2018-03-28 DIAGNOSIS — M797 Fibromyalgia: Secondary | ICD-10-CM | POA: Diagnosis not present

## 2018-03-28 DIAGNOSIS — E559 Vitamin D deficiency, unspecified: Secondary | ICD-10-CM | POA: Diagnosis not present

## 2018-03-28 DIAGNOSIS — M170 Bilateral primary osteoarthritis of knee: Secondary | ICD-10-CM | POA: Diagnosis not present

## 2018-03-28 DIAGNOSIS — Z8719 Personal history of other diseases of the digestive system: Secondary | ICD-10-CM | POA: Diagnosis not present

## 2018-03-28 DIAGNOSIS — M2550 Pain in unspecified joint: Secondary | ICD-10-CM

## 2018-03-28 DIAGNOSIS — M5030 Other cervical disc degeneration, unspecified cervical region: Secondary | ICD-10-CM | POA: Diagnosis not present

## 2018-03-28 DIAGNOSIS — E66813 Obesity, class 3: Secondary | ICD-10-CM

## 2018-03-28 DIAGNOSIS — G4730 Sleep apnea, unspecified: Secondary | ICD-10-CM

## 2018-03-28 DIAGNOSIS — Z6841 Body Mass Index (BMI) 40.0 and over, adult: Secondary | ICD-10-CM

## 2018-03-28 DIAGNOSIS — M5136 Other intervertebral disc degeneration, lumbar region: Secondary | ICD-10-CM | POA: Diagnosis not present

## 2018-03-28 DIAGNOSIS — M19041 Primary osteoarthritis, right hand: Secondary | ICD-10-CM | POA: Diagnosis not present

## 2018-03-28 DIAGNOSIS — E785 Hyperlipidemia, unspecified: Secondary | ICD-10-CM | POA: Diagnosis not present

## 2018-03-28 DIAGNOSIS — M51369 Other intervertebral disc degeneration, lumbar region without mention of lumbar back pain or lower extremity pain: Secondary | ICD-10-CM

## 2018-03-28 NOTE — Patient Instructions (Signed)
Natural anti-inflammatories  You can purchase these at [employer], [employer] or online.  . Turmeric (capsules)  . Ginger (ginger root or capsules)  . Omega 3 (Fish, flax seeds, chia seeds, walnuts, almonds)  . Tart cherry (dried or extract)   Patient should be under the care of a physician while taking these supplements. This may not be reproduced without the permission of Dr. Arles Rumbold.   Knee Exercises Ask your health care provider which exercises are safe for you. Do exercises exactly as told by your health care provider and adjust them as directed. It is normal to feel mild stretching, pulling, tightness, or discomfort as you do these exercises, but you should stop right away if you feel sudden pain or your pain gets worse.Do not begin these exercises until told by your health care provider. STRETCHING AND RANGE OF MOTION EXERCISES These exercises warm up your muscles and joints and improve the movement and flexibility of your knee. These exercises also help to relieve pain, numbness, and tingling. Exercise A: Knee Extension, Prone 1. Lie on your abdomen on a bed. 2. Place your left / right knee just beyond the edge of the surface so your knee is not on the bed. You can put a towel under your left / right thigh just above your knee for comfort. 3. Relax your leg muscles and allow gravity to straighten your knee. You should feel a stretch behind your left / right knee. 4. Hold this position for __________ seconds. 5. Scoot up so your knee is supported between repetitions. Repeat __________ times. Complete this stretch __________ times a day. Exercise B: Knee Flexion, Active  1. Lie on your back with both knees straight. If this causes back discomfort, bend your left / right knee so your foot is flat on the floor. 2. Slowly slide your left / right heel back toward your buttocks until you feel a gentle stretch in the front of your knee or thigh. 3. Hold this position  for __________ seconds. 4. Slowly slide your left / right heel back to the starting position. Repeat __________ times. Complete this exercise __________ times a day. Exercise C: Quadriceps, Prone  1. Lie on your abdomen on a firm surface, such as a bed or padded floor. 2. Bend your left / right knee and hold your ankle. If you cannot reach your ankle or pant leg, loop a belt around your foot and grab the belt instead. 3. Gently pull your heel toward your buttocks. Your knee should not slide out to the side. You should feel a stretch in the front of your thigh and knee. 4. Hold this position for __________ seconds. Repeat __________ times. Complete this stretch __________ times a day. Exercise D: Hamstring, Supine 1. Lie on your back. 2. Loop a belt or towel over the ball of your left / right foot. The ball of your foot is on the walking surface, right under your toes. 3. Straighten your left / right knee and slowly pull on the belt to raise your leg until you feel a gentle stretch behind your knee. ? Do not let your left / right knee bend while you do this. ? Keep your other leg flat on the floor. 4. Hold this position for __________ seconds. Repeat __________ times. Complete this stretch __________ times a day. STRENGTHENING EXERCISES These exercises build strength and endurance in your knee. Endurance is the ability to use your muscles for a long time, even after they get tired. Exercise   E: Quadriceps, Isometric  1. Lie on your back with your left / right leg extended and your other knee bent. Put a rolled towel or small pillow under your knee if told by your health care provider. 2. Slowly tense the muscles in the front of your left / right thigh. You should see your kneecap slide up toward your hip or see increased dimpling just above the knee. This motion will push the back of the knee toward the floor. 3. For __________ seconds, keep the muscle as tight as you can without increasing  your pain. 4. Relax the muscles slowly and completely. Repeat __________ times. Complete this exercise __________ times a day. Exercise F: Straight Leg Raises - Quadriceps 1. Lie on your back with your left / right leg extended and your other knee bent. 2. Tense the muscles in the front of your left / right thigh. You should see your kneecap slide up or see increased dimpling just above the knee. Your thigh may even shake a bit. 3. Keep these muscles tight as you raise your leg 4-6 inches (10-15 cm) off the floor. Do not let your knee bend. 4. Hold this position for __________ seconds. 5. Keep these muscles tense as you lower your leg. 6. Relax your muscles slowly and completely after each repetition. Repeat __________ times. Complete this exercise __________ times a day. Exercise G: Hamstring, Isometric 1. Lie on your back on a firm surface. 2. Bend your left / right knee approximately __________ degrees. 3. Dig your left / right heel into the surface as if you are trying to pull it toward your buttocks. Tighten the muscles in the back of your thighs to dig as hard as you can without increasing any pain. 4. Hold this position for __________ seconds. 5. Release the tension gradually and allow your muscles to relax completely for __________ seconds after each repetition. Repeat __________ times. Complete this exercise __________ times a day. Exercise H: Hamstring Curls  If told by your health care provider, do this exercise while wearing ankle weights. Begin with __________ weights. Then increase the weight by 1 lb (0.5 kg) increments. Do not wear ankle weights that are more than __________. 1. Lie on your abdomen with your legs straight. 2. Bend your left / right knee as far as you can without feeling pain. Keep your hips flat against the floor. 3. Hold this position for __________ seconds. 4. Slowly lower your leg to the starting position.  Repeat __________ times. Complete this exercise  __________ times a day. Exercise I: Squats (Quadriceps) 1. Stand in front of a table, with your feet and knees pointing straight ahead. You may rest your hands on the table for balance but not for support. 2. Slowly bend your knees and lower your hips like you are going to sit in a chair. ? Keep your weight over your heels, not over your toes. ? Keep your lower legs upright so they are parallel with the table legs. ? Do not let your hips go lower than your knees. ? Do not bend lower than told by your health care provider. ? If your knee pain increases, do not bend as low. 3. Hold the squat position for __________ seconds. 4. Slowly push with your legs to return to standing. Do not use your hands to pull yourself to standing. Repeat __________ times. Complete this exercise __________ times a day. Exercise J: Wall Slides (Quadriceps)  1. Lean your back against a smooth wall or door   while you walk your feet out 18-24 inches (46-61 cm) from it. 2. Place your feet hip-width apart. 3. Slowly slide down the wall or door until your knees bend __________ degrees. Keep your knees over your heels, not over your toes. Keep your knees in line with your hips. 4. Hold for __________ seconds. Repeat __________ times. Complete this exercise __________ times a day. Exercise K: Straight Leg Raises - Hip Abductors 1. Lie on your side with your left / right leg in the top position. Lie so your head, shoulder, knee, and hip line up. You may bend your bottom knee to help you keep your balance. 2. Roll your hips slightly forward so your hips are stacked directly over each other and your left / right knee is facing forward. 3. Leading with your heel, lift your top leg 4-6 inches (10-15 cm). You should feel the muscles in your outer hip lifting. ? Do not let your foot drift forward. ? Do not let your knee roll toward the ceiling. 4. Hold this position for __________ seconds. 5. Slowly return your leg to the starting  position. 6. Let your muscles relax completely after each repetition. Repeat __________ times. Complete this exercise __________ times a day. Exercise L: Straight Leg Raises - Hip Extensors 1. Lie on your abdomen on a firm surface. You can put a pillow under your hips if that is more comfortable. 2. Tense the muscles in your buttocks and lift your left / right leg about 4-6 inches (10-15 cm). Keep your knee straight as you lift your leg. 3. Hold this position for __________ seconds. 4. Slowly lower your leg to the starting position. 5. Let your leg relax completely after each repetition. Repeat __________ times. Complete this exercise __________ times a day. This information is not intended to replace advice given to you by your health care provider. Make sure you discuss any questions you have with your health care provider. Document Released: 09/15/2005 Document Revised: 07/26/2016 Document Reviewed: 09/07/2015 Elsevier Interactive Patient Education  2018 [employer]  

## 2018-04-06 ENCOUNTER — Other Ambulatory Visit: Payer: Self-pay | Admitting: Physician Assistant

## 2018-04-06 DIAGNOSIS — Z1231 Encounter for screening mammogram for malignant neoplasm of breast: Secondary | ICD-10-CM

## 2018-04-17 ENCOUNTER — Ambulatory Visit (INDEPENDENT_AMBULATORY_CARE_PROVIDER_SITE_OTHER): Payer: Medicare Other | Admitting: Orthopaedic Surgery

## 2018-04-17 ENCOUNTER — Encounter (INDEPENDENT_AMBULATORY_CARE_PROVIDER_SITE_OTHER): Payer: Self-pay | Admitting: Orthopaedic Surgery

## 2018-04-17 VITALS — BP 127/76 | HR 84 | Ht 66.0 in | Wt 250.0 lb

## 2018-04-17 DIAGNOSIS — M170 Bilateral primary osteoarthritis of knee: Secondary | ICD-10-CM | POA: Diagnosis not present

## 2018-04-17 NOTE — Progress Notes (Signed)
Office Visit Note   Patient: Rachel Hess           Date of Birth: 07-Nov-1951           MRN: [id number] Visit Date: 04/17/2018              Requested by: Nanci Pina, [place], [place] [id number] PCP: Gaye Alken, PA-C   Assessment & Plan: Visit Diagnoses:  1. Primary osteoarthritis of both knees     Plan: End-stage osteoarthritis left knee.  Have arthritis in her right knee as well but not to the extent as the left.  Long discussion with Rachel Hess over about 45 minutes regarding different treatment options.  She like to proceed with a knee replacement.  Discussed the preoperative clearance, surgery, postoperative course.  She was concerned about going to rehab postop as she does not have anyone at home but does have family that could come and stay with her.  I would urge her to organize that.  She would like to proceed  Follow-Up Instructions: Return will schedule TKR left.   Orders:  No orders of the defined types were placed in this encounter.  No orders of the defined types were placed in this encounter.     Procedures: No procedures performed   Clinical Data: No additional findings.   Subjective: Chief Complaint  Patient presents with  . Left Knee - Pain  . Follow-up    DISCUSS LEFT TKR 5-6 YRS JUST GETTING WORSE   Rachel Hess is 67 years old with a long history of osteoarthritis of both knees.  She has been seen by Dr. Estanislado Pandy and is presently taking ibuprofen 800 mg twice a day.  She has not been interested in any type of injection.  She has reached a point where her knees are really a compromised .she is more symptomatic on the left. I have reviewed her films on the PACS system with evidence of end-stage osteoarthritis of the left knee with significant narrowing of the and about 4 5 degrees of varus.  There are tri compartmental changes. She is not diabetic.  BMI is 40.3 Rachel Hess has had some compromise of her  sleep pattern.  As well as her activities of daily living.  She is aware of the different treatment options and would like to proceed with a knee replacement HPI  Review of Systems  Constitutional: Positive for fatigue. Negative for fever.  HENT: Negative for ear pain.   Eyes: Negative for pain.  Respiratory: Negative for cough and shortness of breath.   Cardiovascular: Positive for leg swelling.  Gastrointestinal: Positive for constipation. Negative for diarrhea.  Genitourinary: Negative for difficulty urinating.  Musculoskeletal: Negative for back pain and neck pain.  Skin: Negative for rash.  Allergic/Immunologic: Negative for food allergies.  Neurological: Positive for weakness. Negative for numbness.  Hematological: Does not bruise/bleed easily.  Psychiatric/Behavioral: Negative for sleep disturbance.     Objective: Vital Signs: BP 127/76 (BP Location: Left Arm, Patient Position: Sitting, Cuff Size: Normal)   Pulse 84   Ht 5\' 6"  (1.676 m)   Wt 250 lb (113.4 kg)   BMI 40.35 kg/m   Physical Exam  Constitutional: She is oriented to person, place, and time. She appears well-developed and well-nourished.  HENT:  Mouth/Throat: Oropharynx is clear and moist.  Eyes: Pupils are equal, round, and reactive to light. EOM are normal.  Pulmonary/Chest: Effort normal.  Neurological: She is alert and oriented  to person, place, and time.  Skin: Skin is warm and dry.  Psychiatric: She has a normal mood and affect. Her behavior is normal.    Ortho Exam awake alert and oriented x3.  Comfortable sitting.  Very pleasant.  Examination left knee with predominant medial joint pain.  Minimal effusion.  Full extension and flexed about 103 or 4 degrees.  Chronic nonpitting ankle swelling.  Neurovascular exam intact.  Foot was nice warm.  No varicosities.  Skin intact.  Straight leg raise negative.  Painless range of motion both hips.  No instability with varus valgus stress.  Some patellar  crepitation. Specialty Comments:  No specialty comments available.  Imaging: No results found.   PMFS History: Patient Active Problem List   Diagnosis Date Noted  . Primary osteoarthritis of both knees 03/24/2018  . DDD (degenerative disc disease), cervical 03/24/2018  . DDD (degenerative disc disease), lumbar 03/24/2018  . Fibromyalgia 03/24/2018  . Dyslipidemia 03/24/2018  . History of gastroesophageal reflux (GERD) 03/24/2018  . Vitamin D deficiency 03/24/2018  . Primary osteoarthritis of both hands 02/28/2018  . Thyromegaly 10/17/2015    Class: Chronic  . Diarrhea 03/12/2014  . Abdominal discomfort 03/12/2014  . Nausea alone 03/12/2014  . Dehydration, mild 03/12/2014  . Observed sleep apnea 01/16/2014  . Prediabetes 01/16/2014  . Edema 01/16/2014  . Lower extremity edema 12/05/2013  . Weight gain 12/05/2013  . Vaginal dryness 12/05/2013  . Chronic constipation 12/05/2013  . Neck fullness 12/05/2013   Past Medical History:  Diagnosis Date  . Allergy    "sinuses"  . Constipation   . Fibromyalgia   . Goiter   . Hyperlipemia   . Morbid obesity ([place])   . OSA on CPAP   . Seasonal allergies   . Vitamin D deficiency     Family History  Problem Relation Age of Onset  . Heart disease Mother   . Cancer Father     Past Surgical History:  Procedure Laterality Date  . CARPAL TUNNEL RELEASE Left   . CHOLECYSTECTOMY OPEN    . COLONOSCOPY W/ POLYPECTOMY    . THYROID LOBECTOMY Left 10/17/2015  . THYROIDECTOMY Left 10/17/2015   Procedure: LEFT THYROIDECTOMY ;  Surgeon: Jerrell Belfast, MD;  Location: [facility];  Service: ENT;  Laterality: Left;  . TONSILLECTOMY    . TUBAL LIGATION    . VAGINAL HYSTERECTOMY     Social History   Occupational History  . Not on file  Tobacco Use  . Smoking status: Former Smoker    Packs/day: 0.50    Years: 25.00    Pack years: 12.50    Types: Cigarettes  . Smokeless tobacco: Never Used  . Tobacco comment: Quit smoking cigarettes in  the 1990  Substance and Sexual Activity  . Alcohol use: Yes    Alcohol/week: 0.0 oz    Comment: 10/17/2015 "might have a couple drinks/month"  . Drug use: No  . Sexual activity: Never    Partners: Male

## 2018-04-24 ENCOUNTER — Encounter: Payer: Self-pay | Admitting: Neurology

## 2018-04-24 ENCOUNTER — Ambulatory Visit (INDEPENDENT_AMBULATORY_CARE_PROVIDER_SITE_OTHER): Payer: Medicare Other | Admitting: Neurology

## 2018-04-24 ENCOUNTER — Encounter

## 2018-04-24 VITALS — BP 124/81 | HR 81 | Ht 66.0 in | Wt 251.5 lb

## 2018-04-24 DIAGNOSIS — Z9989 Dependence on other enabling machines and devices: Secondary | ICD-10-CM

## 2018-04-24 DIAGNOSIS — G4733 Obstructive sleep apnea (adult) (pediatric): Secondary | ICD-10-CM

## 2018-04-24 NOTE — Progress Notes (Signed)
Subjective:    Patient ID: Rachel Hess is a 67 y.o. female.  HPI     Interim history:   Ms. Rachel Hess is a 67 year old right-handed woman with an underlying medical history of vitamin D deficiency, hypertension, hyperlipidemia, and morbid obesity, who presents for follow-up consultation of her obstructive sleep apnea after a long gap of almost 3 years. Patient is unaccompanied today. I first met her on 07/28/2015, at which time she reported a prior diagnosis of sleep apnea. She had a sleep study in April 2015 which showed an AHI of 16.4 per hour, O2 nadir was 85%. I suggested we proceed with a CPAP titration study. She had this on 08/27/2015. Sleep efficiency was 69.9%, sleep latency 6.5 minutes, wake after sleep onset 107 minutes. She had an increased percentage of stage II sleep, a decreased percentage of slow-wave sleep and a mildly decreased percentage of REM sleep. She was titrated on CPAP from 5 cm to 7 cm. On the final pressure her AHI was 0 per hour with REM sleep achieved, O2 nadir for the study was 88%. Based on her test results I started her on CPAP therapy at home with a pressure of 7 cm. Today, 04/24/2018: I reviewed her CPAP compliance data from 03/21/2018 04/19/2018 which is a total of 30 days, during which time she used her CPAP every night with percent used days greater than 4 hours at 97%, indicating excellent compliance with an average usage of 8 hours and 2 minutes, residual AHI at goal at 0.5 per hour, leak on the lower end with the 95th percentile at 5.6 L/m on a pressure of 7 cm with EPR of 3. She reports doing well with her CPAP, nasal pillows work well for her. She has no new complaints. She feels that using CPAP has helped her sleep quality and daytime somnolence. She is fully compliant with treatment, her DME company is [facility], she is typically up-to-date with supplies. She has been started on metformin recently. She reports left knee pain, has seen orthopedics  for this.  The patient's allergies, current medications, family history, past medical history, past social history, past surgical history and problem list were reviewed and updated as appropriate.   Previously:  07/28/2015: (She) reports snoring and excessive daytime somnolence. I reviewed your office note from 04/02/2015 which you kindly included. She also had blood work through your office on 04/02/2015: Free T3 was normal, TSH normal, free T4 normal. She had a baseline sleep study and [facility] on 02/27/2014 which I reviewed: Study was interpreted by Dr. Baird Lyons. She weighed 240 pounds at the time with a BMI of 38.7. Neck circumference was documented at 15 inches. She had a sleep efficiency of 87.6%. She had absence of slow-wave sleep and REM sleep at 28.7% with a REM latency of 66.5 minutes. Arousal index was 17.3 per hour. She had a total AHI of 16.4 per hour, REM AHI was 42.9 per hour. She had moderate to loud snoring. Oxygen saturation on average was 93.2%, nadir was 85%. She had PVCs on EKG. She was advised to proceed with a CPAP titration study but did not proceed due to cost. She would be willing to go on CPAP therapy. Sadly, she lost 2 brothers with the last 6 weeks. One brother had complications from his diabetes, the other had a massive stroke. Her main complaint about her sleep is sleep disruption, and nonrestorative sleep. Her Epworth sleepiness score is 2 out of 24, her  fatigue score is 17 out of 63. She denies a family history of obstructive sleep apnea, but is not completely sure. She is a side sleeper. She denies any frank restless leg symptoms and leg twitching at night. She is trying to lose weight. Since her daytime sleep study from April 2015, she has lost about 8 pounds. She has had a goiter. She had a thyroid ultrasound guided I see on 07/02/2015 but results are pending. She is waiting for her test results and has called your office a couple of times she states.  she  reports occasional morning headaches about once every 2 weeks or so. She has nocturia about once per night on average. She works full-time as an Software engineer for [employer], working for Clinical biochemist of transportation. She has 1 grandson and 2 granddaughters. She quit smoking in 1990, she drinks one cup of coffee per day, she drinks alcohol very occasionally. She lives alone and has done so since 1990.  Her Past Medical History Is Significant For: Past Medical History:  Diagnosis Date  . Allergy    "sinuses"  . Constipation   . Fibromyalgia   . Goiter   . Hyperlipemia   . Morbid obesity ([place])   . OSA on CPAP   . Seasonal allergies   . Vitamin D deficiency     Her Past Surgical History Is Significant For: Past Surgical History:  Procedure Laterality Date  . CARPAL TUNNEL RELEASE Left   . CHOLECYSTECTOMY OPEN    . COLONOSCOPY W/ POLYPECTOMY    . THYROID LOBECTOMY Left 10/17/2015  . THYROIDECTOMY Left 10/17/2015   Procedure: LEFT THYROIDECTOMY ;  Surgeon: Jerrell Belfast, MD;  Location: [facility];  Service: ENT;  Laterality: Left;  . TONSILLECTOMY    . TUBAL LIGATION    . VAGINAL HYSTERECTOMY      Her Family History Is Significant For: Family History  Problem Relation Age of Onset  . Heart disease Mother   . Cancer Father     Her Social History Is Significant For: Social History   Socioeconomic History  . Marital status: Divorced    Spouse name: Annalee Genta  . Number of children: 1  . Years of education: 65  . Highest education level: Not on file  Occupational History  . Not on file  Social Needs  . Financial resource strain: Not on file  . Food insecurity:    Worry: Not on file    Inability: Not on file  . Transportation needs:    Medical: Not on file    Non-medical: Not on file  Tobacco Use  . Smoking status: Former Smoker    Packs/day: 0.50    Years: 25.00    Pack years: 12.50    Types: Cigarettes  . Smokeless tobacco: Never Used  . Tobacco  comment: Quit smoking cigarettes in the 1990  Substance and Sexual Activity  . Alcohol use: Yes    Alcohol/week: 0.0 oz    Comment: 10/17/2015 "might have a couple drinks/month"  . Drug use: No  . Sexual activity: Never    Partners: Male  Lifestyle  . Physical activity:    Days per week: Not on file    Minutes per session: Not on file  . Stress: Not on file  Relationships  . Social connections:    Talks on phone: Not on file    Gets together: Not on file    Attends religious service: Not on file    Active member of club  or organization: Not on file    Attends meetings of clubs or organizations: Not on file    Relationship status: Not on file  Other Topics Concern  . Not on file  Social History Narrative   Drinks about 1 cup of coffee a day     Her Allergies Are:  No Known Allergies:   Her Current Medications Are:  Outpatient Encounter Medications as of 04/24/2018  Medication Sig  . fexofenadine-pseudoephedrine (ALLEGRA-D 24) 180-240 MG per 24 hr tablet Take 1 tablet by mouth daily as needed.   . hydrochlorothiazide (HYDRODIURIL) 25 MG tablet Take 25 mg by mouth daily.   Marland Kitchen ibuprofen (ADVIL,MOTRIN) 800 MG tablet Take 800 mg by mouth every 8 (eight) hours as needed for mild pain or moderate pain.   . metFORMIN (GLUCOPHAGE) 500 MG tablet Take 500 mg by mouth 2 (two) times daily with a meal.  . [DISCONTINUED] diclofenac sodium (VOLTAREN) 1 % GEL Apply three grams to three large joints up to three times a day  . [DISCONTINUED] HYDROcodone-acetaminophen (NORCO) 5-325 MG tablet Take 1-2 tablets by mouth every 6 (six) hours as needed.  . [DISCONTINUED] hydrOXYzine (VISTARIL) 50 MG capsule Take 50 mg by mouth every 6 (six) hours as needed for anxiety.    No facility-administered encounter medications on file as of 04/24/2018.   :  Review of Systems:  Out of a complete 14 point review of systems, all are reviewed and negative with the exception of these symptoms as listed  below:  Review of Systems  Neurological:       Patient reports that she has been doing well.     Objective:  Neurological Exam  Physical Exam Physical Examination:   Vitals:   04/24/18 1138  BP: 124/81  Pulse: 81    General Examination: The patient is a very pleasant 68 y.o. female in no acute distress. She appears well-developed and well-nourished and well groomed.   HEENT: Normocephalic, atraumatic, pupils are equal, round and reactive to light and accommodation. Extraocular tracking is good without limitation to gaze excursion or nystagmus noted. Normal smooth pursuit is noted. Hearing is grossly intact. Face is symmetric with normal facial animation and normal facial sensation. Speech is clear with no dysarthria noted. There is no hypophonia. There is no lip, neck/head, jaw or voice tremor. Oropharynx exam reveals: mild mouth dryness, adequate dental hygiene and mild airway crowding.   Chest: Clear to auscultation without wheezing, rhonchi or crackles noted.  Heart: S1+S2+0, regular and normal without murmurs, rubs or gallops noted.   Abdomen: Soft, non-tender and non-distended with normal bowel sounds appreciated on auscultation.  Extremities: There is no pitting edema but left ankle puffiness is noted.  Skin: Warm and dry without trophic changes noted. There are no varicose veins.  Musculoskeletal: exam reveals no obvious joint deformities, tenderness or joint swelling or erythema, with the exception of left knee pain reported.   Neurologically:  Mental status: The patient is awake, alert and oriented in all 4 spheres. Her immediate and remote memory, attention, language skills and fund of knowledge are appropriate. There is no evidence of aphasia, agnosia, apraxia or anomia. Speech is clear with normal prosody and enunciation. Thought process is linear. Mood is normal and affect is normal.  Cranial nerves II - XII are as described above under HEENT exam.  Motor  exam: Normal bulk, strength and tone is noted. There is no drift, tremor or rebound. Romberg is negative.  Fine motor skills and coordination: grossly intact.  Cerebellar testing: No dysmetria or intention tremor.  Sensory exam: intact to light touch.  Gait, station and balance: She stands easily. No veering to one side is noted. No leaning to one side is noted. Posture is age-appropriate and stance is narrow based. Gait shows normal stride length and normal pace.    Assessment and Plan:   In summary, Rachel Hess is a very pleasant 67 year old female with an underlying medical history of vitamin D deficiency, hypertension, hyperlipidemia, and morbid obesity, who presents for follow-up consultation of her moderate obstructive sleep apnea as determined by her baseline sleep study onn 02/27/14. She had a subsequent CPAP titration study and has been on CPAP therapy. She is fully compliant with treatment and commended for this. She indicates good results with CPAP as far sleep consolidation, sleep quality and daytime somnolence. She is commended for treatment adherence and advised to follow-up yearly routinely. I suggested a one-year checkup with one of our nurse practitioners at this point. She is advised to continue with treatment as well as she has been. I renewed a DME order for CPAP related supplies which we will send to her current DME company. I answered all her questions today and she was in agreement. I spent 15 minutes in total face-to-face time with the patient, more than 50% of which was spent in counseling and coordination of care, reviewing test results, reviewing medication and discussing or reviewing the diagnosis of OSA its prognosis and treatment options. Pertinent laboratory and imaging test results that were available during this visit with the patient were reviewed by me and considered in my medical decision making (see chart for details).

## 2018-04-24 NOTE — Patient Instructions (Addendum)

## 2018-05-12 ENCOUNTER — Ambulatory Visit: Payer: Medicare Other

## 2018-06-05 ENCOUNTER — Ambulatory Visit
Admission: RE | Admit: 2018-06-05 | Discharge: 2018-06-05 | Disposition: A | Discharge status: Home / Self Care | Payer: Medicare Other | Source: Ambulatory Visit | Attending: Physician Assistant | Admitting: Physician Assistant

## 2018-06-05 DIAGNOSIS — Z1231 Encounter for screening mammogram for malignant neoplasm of breast: Secondary | ICD-10-CM

## 2018-09-20 NOTE — Progress Notes (Deleted)
Office Visit Note  Patient: Rachel Hess             Date of Birth: 10/23/1951           MRN: [id number]             PCP: Carita Pian, [place] Referring: Suella Broad,* Visit Date: 09/28/2018 Occupation: @GUAROCC @  Subjective:  No chief complaint on file.   History of Present Illness: Rachel Hess is a 67 y.o. female ***   Activities of Daily Living:  Patient reports morning stiffness for *** {minute/hour:19697}.   Patient {ACTIONS;DENIES/REPORTS:21021675::"Denies"} nocturnal pain.  Difficulty dressing/grooming: {ACTIONS;DENIES/REPORTS:21021675::"Denies"} Difficulty climbing stairs: {ACTIONS;DENIES/REPORTS:21021675::"Denies"} Difficulty getting out of chair: {ACTIONS;DENIES/REPORTS:21021675::"Denies"} Difficulty using hands for taps, buttons, cutlery, and/or writing: {ACTIONS;DENIES/REPORTS:21021675::"Denies"}  No Rheumatology ROS completed.   PMFS History:  Patient Active Problem List   Diagnosis Date Noted  . Primary osteoarthritis of both knees 03/24/2018  . DDD (degenerative disc disease), cervical 03/24/2018  . DDD (degenerative disc disease), lumbar 03/24/2018  . Fibromyalgia 03/24/2018  . Dyslipidemia 03/24/2018  . History of gastroesophageal reflux (GERD) 03/24/2018  . Vitamin D deficiency 03/24/2018  . Primary osteoarthritis of both hands 02/28/2018  . Thyromegaly 10/17/2015    Class: Chronic  . Diarrhea 03/12/2014  . Abdominal discomfort 03/12/2014  . Nausea alone 03/12/2014  . Dehydration, mild 03/12/2014  . Observed sleep apnea 01/16/2014  . Prediabetes 01/16/2014  . Edema 01/16/2014  . Lower extremity edema 12/05/2013  . Weight gain 12/05/2013  . Vaginal dryness 12/05/2013  . Chronic constipation 12/05/2013  . Neck fullness 12/05/2013    Past Medical History:  Diagnosis Date  . Allergy    "sinuses"  . Constipation   . Fibromyalgia   . Goiter   . Hyperlipemia   . Morbid obesity ([place])   . OSA on CPAP   . Seasonal  allergies   . Vitamin D deficiency     Family History  Problem Relation Age of Onset  . Heart disease Mother   . Cancer Father    Past Surgical History:  Procedure Laterality Date  . CARPAL TUNNEL RELEASE Left   . CHOLECYSTECTOMY OPEN    . COLONOSCOPY W/ POLYPECTOMY    . THYROID LOBECTOMY Left 10/17/2015  . THYROIDECTOMY Left 10/17/2015   Procedure: LEFT THYROIDECTOMY ;  Surgeon: Jerrell Belfast, MD;  Location: [facility];  Service: ENT;  Laterality: Left;  . TONSILLECTOMY    . TUBAL LIGATION    . VAGINAL HYSTERECTOMY     Social History   Social History Narrative   Drinks about 1 cup of coffee a day     Objective: Vital Signs: There were no vitals taken for this visit.   Physical Exam   Musculoskeletal Exam: ***  CDAI Exam: CDAI Score: Not documented Patient Global Assessment: Not documented; Provider Global Assessment: Not documented Swollen: Not documented; Tender: Not documented Joint Exam   Not documented   There is currently no information documented on the homunculus. Go to the Rheumatology activity and complete the homunculus joint exam.  Investigation: No additional findings.  Imaging: No results found.  Recent Labs: Lab Results  Component Value Date   WBC 14.2 (H) 10/17/2015   HGB 12.1 10/17/2015   PLT 259 10/17/2015   NA 141 10/13/2015   K 4.1 10/13/2015   CL 108 10/13/2015   CO2 26 10/13/2015   GLUCOSE 108 (H) 10/13/2015   BUN 11 10/13/2015   CREATININE 0.71 10/17/2015   BILITOT 0.3 03/12/2014   ALKPHOS 91  03/12/2014   AST 21 03/12/2014   ALT 24 03/12/2014   PROT 6.9 03/12/2014   ALBUMIN 4.0 03/12/2014   CALCIUM 9.5 10/13/2015   GFRAA >60 10/17/2015    Speciality Comments: No specialty comments available.  Procedures:  No procedures performed Allergies: Patient has no known allergies.   Assessment / Plan:     Visit Diagnoses: No diagnosis found.   Orders: No orders of the defined types were placed in this encounter.  No orders of  the defined types were placed in this encounter.   Face-to-face time spent with patient was *** minutes. Greater than 50% of time was spent in counseling and coordination of care.  Follow-Up Instructions: No follow-ups on file.   Earnestine Mealing, CMA  Note - This record has been created using Editor, commissioning.  Chart creation errors have been sought, but may not always  have been located. Such creation errors do not reflect on  the standard of medical care.

## 2018-09-28 ENCOUNTER — Ambulatory Visit: Payer: 59 | Admitting: Rheumatology

## 2019-04-24 ENCOUNTER — Encounter: Payer: Self-pay | Admitting: Adult Health

## 2019-04-25 ENCOUNTER — Telehealth: Payer: Self-pay | Admitting: *Deleted

## 2019-04-25 NOTE — Telephone Encounter (Signed)
Due to current COVID 19 pandemic, our office is severely reducing in office visits until further notice, in order to minimize the risk to our patients and healthcare providers.  Pt understands that although there may be some limitations with this type of visit, we will take all precautions to reduce any security or privacy concerns.  Pt understands that this will be treated like an in office visit and we will file with pt's insurance, and there may be a patient responsible charge related to this service. Pt consented to [email or web address] visit.  Email sent to [email or web address]. '

## 2019-04-30 ENCOUNTER — Encounter: Payer: Self-pay | Admitting: Adult Health

## 2019-04-30 ENCOUNTER — Ambulatory Visit (INDEPENDENT_AMBULATORY_CARE_PROVIDER_SITE_OTHER): Payer: Medicare Other | Admitting: Adult Health

## 2019-04-30 ENCOUNTER — Other Ambulatory Visit: Payer: Self-pay

## 2019-04-30 DIAGNOSIS — Z9989 Dependence on other enabling machines and devices: Secondary | ICD-10-CM

## 2019-04-30 DIAGNOSIS — G4733 Obstructive sleep apnea (adult) (pediatric): Secondary | ICD-10-CM | POA: Diagnosis not present

## 2019-04-30 NOTE — Progress Notes (Signed)
  [facility] [address] [place]. [place] [address] [phone]     Virtual Visit via Telephone Note  I connected with [place] on 04/30/19 at  1:00 PM EDT by telephone located remotely at [facility] and verified that I am speaking with the correct person using two identifiers who reports being located at home.    Visit scheduled by RN. She discussed the limitations, risks, security and privacy concerns of performing an evaluation and management service by telephone and the availability of in person appointments. She also discussed with the patient that there may be a patient responsible charge related to this service. The patient expressed understanding and agreed to proceed. See telephone note for consent and additional scheduling information.    History of Present Illness:  Rachel Hess is a 68 y.o. female who has been followed in this office for OSA on CPAP. She was initially scheduled for face-to-face office follow up visit today time but due to [place], visit rescheduled for non-face-to-face telephone visit with patients consent. Unable to participate in video visit due to lack of access to device with camera.    Today 04/30/19  Rachel Hess is a 68 year old female with a history of OSA on CPAP.  Her download indicates that she use her machine 30 out of 30 days for compliance of 100%.  She use her machine greater than 4 hours each night.  On average she uses her machine 8 hours and 58 minutes.  Her residual AHI is 0.5 cm of water with EPR 3.  She states that she continues to see the benefit of using CPAP.  She states that the straps give out easily.  She would also like to try the large nasal pillows.     Observations/Objective:  Generalized: Well developed, in no acute distress   Neurological examination  Mentation: Alert oriented to time, place, history taking. Follows all commands speech and language fluent   Assessment and Plan:  1.  Obstructive sleep apnea on CPAP  The patient CPAP download shows excellent compliance and good treatment of her apnea.  She is encouraged to continue using her CPAP nightly and greater than 4 hours each night.  I will send an order to her DME company requesting new straps and large nasal pillows.  She will follow-up in 1 year or sooner if needed.  Follow Up Instructions:   Follow-up in 1 year    I discussed the assessment and treatment plan with the patient.  The patient was provided an opportunity to ask questions and all were answered to their satisfaction. The patient agreed with the plan and verbalized an understanding of the instructions.   I provided 15 minutes of non-face-to-face time during this encounter.    Ward Givens, NP-BC  [facility] [address] [place], [place] [address]  Phone [phone] Fax [phone]

## 2019-05-01 NOTE — Progress Notes (Signed)
CMM sent and received by [employer]. sy

## 2019-06-04 ENCOUNTER — Other Ambulatory Visit: Payer: Self-pay | Admitting: Family Medicine

## 2019-06-04 DIAGNOSIS — Z1231 Encounter for screening mammogram for malignant neoplasm of breast: Secondary | ICD-10-CM

## 2019-07-18 ENCOUNTER — Ambulatory Visit
Admission: RE | Admit: 2019-07-18 | Discharge: 2019-07-18 | Disposition: A | Discharge status: Home / Self Care | Payer: Medicare Other | Source: Ambulatory Visit | Attending: Family Medicine | Admitting: Family Medicine

## 2019-07-18 ENCOUNTER — Other Ambulatory Visit: Payer: Self-pay

## 2019-07-18 DIAGNOSIS — Z1231 Encounter for screening mammogram for malignant neoplasm of breast: Secondary | ICD-10-CM

## 2019-12-09 ENCOUNTER — Ambulatory Visit: Payer: Medicare Other | Attending: Internal Medicine

## 2019-12-09 DIAGNOSIS — Z23 Encounter for immunization: Secondary | ICD-10-CM | POA: Insufficient documentation

## 2019-12-10 NOTE — Progress Notes (Signed)
   Covid-19 Vaccination Clinic  Name:  Rachel Hess    MRN: [id number] DOB: 21-Feb-1951  12/09/2019  [name] was observed post Covid-19 immunization for 15 minutes without incidence. She was provided with Vaccine Information Sheet and instruction to access the V-Safe system.   [name] was instructed to call 911 with any severe reactions post vaccine: Marland Kitchen Difficulty breathing  . Swelling of your face and throat  . A fast heartbeat  . A bad rash all over your body  . Dizziness and weakness    Immunizations Administered    Name Date Dose VIS Date Route   Moderna COVID-19 Vaccine 12/09/2019  2:32 PM 0.5 mL 10/16/2019 Intramuscular   Manufacturer: Levan Hurst   LotJE:[id number]   [id number]     Documented on behalf of C. Hubberd

## 2019-12-27 DIAGNOSIS — M25561 Pain in right knee: Secondary | ICD-10-CM | POA: Insufficient documentation

## 2020-01-06 ENCOUNTER — Ambulatory Visit: Payer: Medicare Other | Attending: Internal Medicine

## 2020-01-06 DIAGNOSIS — Z23 Encounter for immunization: Secondary | ICD-10-CM

## 2020-01-06 NOTE — Progress Notes (Signed)
   Covid-19 Vaccination Clinic  Name:  Rachel Hess    MRN: [id number] DOB: June 30, 1951  01/06/2020  [name] was observed post Covid-19 immunization for 15 minutes without incidence. She was provided with Vaccine Information Sheet and instruction to access the V-Safe system.   [name] was instructed to call 911 with any severe reactions post vaccine: Marland Kitchen Difficulty breathing  . Swelling of your face and throat  . A fast heartbeat  . A bad rash all over your body  . Dizziness and weakness    Immunizations Administered    Name Date Dose VIS Date Route   Moderna COVID-19 Vaccine 01/06/2020  4:21 PM 0.5 mL 10/16/2019 Intramuscular   Manufacturer: Moderna   Lot: [id number]   [id number]

## 2020-01-11 DIAGNOSIS — M23303 Other meniscus derangements, unspecified medial meniscus, right knee: Secondary | ICD-10-CM | POA: Insufficient documentation

## 2020-04-30 ENCOUNTER — Ambulatory Visit: Payer: 59 | Admitting: Adult Health

## 2020-06-13 ENCOUNTER — Other Ambulatory Visit: Payer: Self-pay | Admitting: Family Medicine

## 2020-06-13 DIAGNOSIS — Z1231 Encounter for screening mammogram for malignant neoplasm of breast: Secondary | ICD-10-CM

## 2020-07-18 ENCOUNTER — Other Ambulatory Visit: Payer: Self-pay

## 2020-07-18 ENCOUNTER — Ambulatory Visit
Admission: RE | Admit: 2020-07-18 | Discharge: 2020-07-18 | Disposition: A | Discharge status: Home / Self Care | Payer: Medicare Other | Source: Ambulatory Visit | Attending: Family Medicine | Admitting: Family Medicine

## 2020-07-18 DIAGNOSIS — Z1231 Encounter for screening mammogram for malignant neoplasm of breast: Secondary | ICD-10-CM

## 2020-11-04 ENCOUNTER — Ambulatory Visit: Payer: Medicare Other | Attending: Internal Medicine

## 2020-11-04 DIAGNOSIS — Z23 Encounter for immunization: Secondary | ICD-10-CM

## 2020-11-04 NOTE — Progress Notes (Signed)
   Covid-19 Vaccination Clinic  Name:  Rachel Hess    MRN: [id number] DOB: [birth year]  11/04/2020  [name] was observed post Covid-19 immunization for 15 minutes without incident. She was provided with Vaccine Information Sheet and instruction to access the V-Safe system.   [name] was instructed to call 911 with any severe reactions post vaccine: Marland Kitchen Difficulty breathing  . Swelling of face and throat  . A fast heartbeat  . A bad rash all over body  . Dizziness and weakness   Immunizations Administered    Name Date Dose VIS Date Route   Moderna Covid-19 Booster Vaccine 11/04/2020  2:04 PM 0.25 mL 09/03/2020 Intramuscular   Manufacturer: Moderna   Lot: [id number]   [place]: [id number]

## 2021-01-26 DIAGNOSIS — I10 Essential (primary) hypertension: Secondary | ICD-10-CM | POA: Diagnosis not present

## 2021-06-04 ENCOUNTER — Other Ambulatory Visit: Payer: Self-pay | Admitting: Family Medicine

## 2021-06-04 DIAGNOSIS — Z1231 Encounter for screening mammogram for malignant neoplasm of breast: Secondary | ICD-10-CM

## 2021-06-18 DIAGNOSIS — Z20822 Contact with and (suspected) exposure to covid-19: Secondary | ICD-10-CM | POA: Diagnosis not present

## 2021-07-28 ENCOUNTER — Other Ambulatory Visit: Payer: Self-pay

## 2021-07-28 ENCOUNTER — Ambulatory Visit
Admission: RE | Admit: 2021-07-28 | Discharge: 2021-07-28 | Disposition: A | Discharge status: Home / Self Care | Payer: Medicare Other | Source: Ambulatory Visit | Attending: Family Medicine | Admitting: Family Medicine

## 2021-07-28 DIAGNOSIS — Z1231 Encounter for screening mammogram for malignant neoplasm of breast: Secondary | ICD-10-CM

## 2021-11-17 NOTE — Progress Notes (Signed)
PATIENT: Rachel Hess DOB: Nov 19, 1950  REASON FOR VISIT: follow up HISTORY FROM: patient PRIMARY NEUROLOGIST: Dr. Rexene Alberts  HISTORY OF PRESENT ILLNESS: Today 11/17/21:  Rachel Hess is a 71 year old female with a history of obstructive sleep apnea on CPAP.  She returns today for follow-up.  She reports that the CPAP is working well for her.  She is interested in inspire.  She returns today for evaluation.    HISTORY 04/30/19   Rachel Hess is a 71 year old female with a history of OSA on CPAP.  Her download indicates that she use her machine 30 out of 30 days for compliance of 100%.  She use her machine greater than 4 hours each night.  On average she uses her machine 8 hours and 58 minutes.  Her residual AHI is 0.5 cm of water with EPR 3.  She states that she continues to see the benefit of using CPAP.  She states that the straps give out easily.  She would also like to try the large nasal pillows.  REVIEW OF SYSTEMS: Out of a complete 14 system review of symptoms, the patient complains only of the following symptoms, and all other reviewed systems are negative.   ESS 4  ALLERGIES: No Known Allergies  HOME MEDICATIONS: Outpatient Medications Prior to Visit  Medication Sig Dispense Refill   fexofenadine-pseudoephedrine (ALLEGRA-D 24) 180-240 MG per 24 hr tablet Take 1 tablet by mouth daily as needed.      hydrochlorothiazide (HYDRODIURIL) 25 MG tablet Take 25 mg by mouth daily.      ibuprofen (ADVIL,MOTRIN) 800 MG tablet Take 800 mg by mouth every 8 (eight) hours as needed for mild pain or moderate pain.      metFORMIN (GLUCOPHAGE) 500 MG tablet Take 500 mg by mouth 2 (two) times daily with a meal.     No facility-administered medications prior to visit.    PAST MEDICAL HISTORY: Past Medical History:  Diagnosis Date   Allergy    "sinuses"   Constipation    Fibromyalgia    Goiter    Hyperlipemia    Morbid obesity ([place])    OSA on CPAP    Seasonal allergies     Vitamin D deficiency     PAST SURGICAL HISTORY: Past Surgical History:  Procedure Laterality Date   CARPAL TUNNEL RELEASE Left    CHOLECYSTECTOMY OPEN     COLONOSCOPY W/ POLYPECTOMY     THYROID LOBECTOMY Left 10/17/2015   THYROIDECTOMY Left 10/17/2015   Procedure: LEFT THYROIDECTOMY ;  Surgeon: Jerrell Belfast, MD;  Location: [facility] OR;  Service: ENT;  Laterality: Left;   TONSILLECTOMY     TUBAL LIGATION     VAGINAL HYSTERECTOMY      FAMILY HISTORY: Family History  Problem Relation Age of Onset   Heart disease Mother    Cancer Father     SOCIAL HISTORY: Social History   Socioeconomic History   Marital status: Divorced    Spouse name: Freddie   Number of children: 1   Years of education: 12   Highest education level: Not on file  Occupational History   Not on file  Tobacco Use   Smoking status: Former    Packs/day: 0.50    Years: 25.00    Pack years: 12.50    Types: Cigarettes   Smokeless tobacco: Never   Tobacco comments:    Quit smoking cigarettes in the 1990  Vaping Use   Vaping Use: Never used  Substance and Sexual  Activity   Alcohol use: Yes    Alcohol/week: 0.0 standard drinks    Comment: 10/17/2015 "might have a couple drinks/month"   Drug use: No   Sexual activity: Never    Partners: Male  Other Topics Concern   Not on file  Social History Narrative   Drinks about 1 cup of coffee a day    Social Determinants of Health   Financial Resource Strain: Not on file  Food Insecurity: Not on file  Transportation Needs: Not on file  Physical Activity: Not on file  Stress: Not on file  Social Connections: Not on file  Intimate Partner Violence: Not on file      PHYSICAL EXAM  Vitals:   11/18/21 1437  BP: 118/84  Pulse: 84  Weight: 246 lb 12.8 oz (111.9 kg)  Height: 5\' 6"  (1.676 m)   Body mass index is 39.83 kg/m.  Generalized: Well developed, in no acute distress  Chest: Lungs clear to auscultation bilaterally  Neurological examination   Mentation: Alert oriented to time, place, history taking. Follows all commands speech and language fluent Cranial nerve II-XII: Extraocular movements were full, visual field were full on confrontational test Head turning and shoulder shrug  were normal and symmetric. Motor: The motor testing reveals 5 over 5 strength of all 4 extremities. Good symmetric motor tone is noted throughout.  Sensory: Sensory testing is intact to soft touch on all 4 extremities. No evidence of extinction is noted.  Gait and station: Gait is normal.    DIAGNOSTIC DATA (LABS, IMAGING, TESTING) - I reviewed patient records, labs, notes, testing and imaging myself where available.  Lab Results  Component Value Date   WBC 14.2 (H) 10/17/2015   HGB 12.1 10/17/2015   HCT 36.8 10/17/2015   MCV 89.8 10/17/2015   PLT 259 10/17/2015      Component Value Date/Time   NA 141 10/13/2015 1037   K 4.1 10/13/2015 1037   CL 108 10/13/2015 1037   CO2 26 10/13/2015 1037   GLUCOSE 108 (H) 10/13/2015 1037   BUN 11 10/13/2015 1037   CREATININE 0.71 10/17/2015 1434   CREATININE 0.71 03/12/2014 1421   CALCIUM 9.5 10/13/2015 1037   PROT 6.9 03/12/2014 1421   ALBUMIN 4.0 03/12/2014 1421   AST 21 03/12/2014 1421   ALT 24 03/12/2014 1421   ALKPHOS 91 03/12/2014 1421   BILITOT 0.3 03/12/2014 1421   GFRNONAA >60 10/17/2015 1434   GFRNONAA 83 12/19/2013 0845   GFRAA >60 10/17/2015 1434   GFRAA >89 12/19/2013 0845   No results found for: CHOL, HDL, LDLCALC, LDLDIRECT, TRIG, CHOLHDL Lab Results  Component Value Date   HGBA1C 6.6 (H) 12/19/2013   No results found for: VITAMINB12 No results found for: TSH    ASSESSMENT AND PLAN 71 y.o. year old female  has a past medical history of Allergy, Constipation, Fibromyalgia, Goiter, Hyperlipemia, Morbid obesity ([place]), OSA on CPAP, Seasonal allergies, and Vitamin D deficiency. here with:  OSA on CPAP  - CPAP compliance excellent - Good treatment of AHI  - Encourage patient to  use CPAP nightly and > 4 hours each night -Discussed inspire however the patient's BMI disqualifies her.  The patient is not interested in inspire at this time regardless. - F/U in 1 year or sooner if needed     Ward Givens, MSN, NP-C 11/17/2021, 4:11 PM [facility] [address], [place], [place] [address] [phone]

## 2021-11-18 ENCOUNTER — Ambulatory Visit (INDEPENDENT_AMBULATORY_CARE_PROVIDER_SITE_OTHER): Payer: Medicare Other | Admitting: Adult Health

## 2021-11-18 ENCOUNTER — Encounter: Payer: Self-pay | Admitting: Adult Health

## 2021-11-18 ENCOUNTER — Other Ambulatory Visit: Payer: Self-pay

## 2021-11-18 VITALS — BP 118/84 | HR 84 | Ht 66.0 in | Wt 246.8 lb

## 2021-11-18 DIAGNOSIS — G4733 Obstructive sleep apnea (adult) (pediatric): Secondary | ICD-10-CM | POA: Diagnosis not present

## 2021-11-18 DIAGNOSIS — Z9989 Dependence on other enabling machines and devices: Secondary | ICD-10-CM | POA: Diagnosis not present

## 2021-11-18 NOTE — Patient Instructions (Signed)
Continue using CPAP nightly and greater than 4 hours each night °If your symptoms worsen or you develop new symptoms please let us know.  ° °

## 2022-01-21 DIAGNOSIS — Z20822 Contact with and (suspected) exposure to covid-19: Secondary | ICD-10-CM | POA: Diagnosis not present

## 2022-04-08 DIAGNOSIS — E1140 Type 2 diabetes mellitus with diabetic neuropathy, unspecified: Secondary | ICD-10-CM | POA: Diagnosis not present

## 2022-04-08 DIAGNOSIS — E559 Vitamin D deficiency, unspecified: Secondary | ICD-10-CM | POA: Diagnosis not present

## 2022-06-08 ENCOUNTER — Telehealth: Payer: Self-pay

## 2022-06-08 ENCOUNTER — Telehealth: Payer: Self-pay | Admitting: Adult Health

## 2022-06-08 DIAGNOSIS — G4733 Obstructive sleep apnea (adult) (pediatric): Secondary | ICD-10-CM

## 2022-06-08 NOTE — Telephone Encounter (Signed)
(  Key: [place]) PA for Armodafinl has been sent.   Your information has been sent to [facility].

## 2022-06-08 NOTE — Telephone Encounter (Signed)
Sleep study was nearly 7 years ago, will order home sleep test for reassessment.

## 2022-06-08 NOTE — Telephone Encounter (Signed)
Pt called and said her CPAP machine have stop working. Pt want to know how she can get a new one and would like a call from the nurse.

## 2022-06-08 NOTE — Telephone Encounter (Signed)
Spoke to patient made her aware Dr Rexene Alberts already will order a HST since its been 7 years . Pt made me aware she has appointment Monday with Megan,NP for a follow up visit to get new CPAP machine the patient  Per patient DME  wants her to have a office visit  since its been six months since last appointment .Marland Kitchen Patient thanked me for calling her back

## 2022-06-08 NOTE — Telephone Encounter (Signed)
Spoke to patient CPAP machine stop working this am . Set up date is 2016   last seen 11/2021 by Jinny Blossom NP  last time CPAP downloaded was 11/2021 . Will forward to Dr Rexene Alberts  to Advise what the next steps for patient to get a new CPAP machine . Pt states she will contact [facility] to make them aware that CPAP machine stop working . Pt thanked me for calling

## 2022-06-13 NOTE — Progress Notes (Unsigned)
PATIENT: Rachel Hess DOB: 04/18/1951  REASON FOR VISIT: follow up HISTORY FROM: patient PRIMARY NEUROLOGIST: Dr. Rexene Alberts  HISTORY OF PRESENT ILLNESS: Today 06/13/22:Rachel Hess is a 71 year old female with a history of obstructive sleep apnea on CPAP.   11/18/21:Rachel Hess is a 71 year old female with a history of obstructive sleep apnea on CPAP.  She returns today for follow-up.  She reports that the CPAP is working well for her.  She is interested in inspire.  She returns today for evaluation.    HISTORY 04/30/19   Rachel Hess is a 71 year old female with a history of OSA on CPAP.  Her download indicates that she use her machine 30 out of 30 days for compliance of 100%.  She use her machine greater than 4 hours each night.  On average she uses her machine 8 hours and 58 minutes.  Her residual AHI is 0.5 cm of water with EPR 3.  She states that she continues to see the benefit of using CPAP.  She states that the straps give out easily.  She would also like to try the large nasal pillows.  REVIEW OF SYSTEMS: Out of a complete 14 system review of symptoms, the patient complains only of the following symptoms, and all other reviewed systems are negative.   ESS 4  ALLERGIES: No Known Allergies  HOME MEDICATIONS: Outpatient Medications Prior to Visit  Medication Sig Dispense Refill   fexofenadine-pseudoephedrine (ALLEGRA-D 24) 180-240 MG per 24 hr tablet Take 1 tablet by mouth daily as needed.      hydrochlorothiazide (HYDRODIURIL) 25 MG tablet Take 25 mg by mouth daily.      ibuprofen (ADVIL,MOTRIN) 800 MG tablet Take 800 mg by mouth every 8 (eight) hours as needed for mild pain or moderate pain.      metFORMIN (GLUCOPHAGE) 500 MG tablet Take 500 mg by mouth 2 (two) times daily with a meal.     tirzepatide (MOUNJARO) 10 MG/0.5ML Pen Inject 5 mg as directed once a week.     No facility-administered medications prior to visit.    PAST MEDICAL HISTORY: Past Medical  History:  Diagnosis Date   Allergy    "sinuses"   Constipation    Fibromyalgia    Goiter    Hyperlipemia    Morbid obesity ([place])    OSA on CPAP    Seasonal allergies    Vitamin D deficiency     PAST SURGICAL HISTORY: Past Surgical History:  Procedure Laterality Date   CARPAL TUNNEL RELEASE Left    cataract     surgery both eyes   CHOLECYSTECTOMY OPEN     COLONOSCOPY W/ POLYPECTOMY     THYROID LOBECTOMY Left 10/17/2015   THYROIDECTOMY Left 10/17/2015   Procedure: LEFT THYROIDECTOMY ;  Surgeon: Jerrell Belfast, MD;  Location: [facility] OR;  Service: ENT;  Laterality: Left;   TONSILLECTOMY     TUBAL LIGATION     VAGINAL HYSTERECTOMY      FAMILY HISTORY: Family History  Problem Relation Age of Onset   Heart disease Mother    Cancer Father    Sleep apnea Neg Hx     SOCIAL HISTORY: Social History   Socioeconomic History   Marital status: Divorced    Spouse name: Freddie   Number of children: 1   Years of education: 12   Highest education level: Not on file  Occupational History   Not on file  Tobacco Use   Smoking status: Former  Packs/day: 0.50    Years: 25.00    Total pack years: 12.50    Types: Cigarettes   Smokeless tobacco: Never   Tobacco comments:    Quit smoking cigarettes in the 1990  Vaping Use   Vaping Use: Never used  Substance and Sexual Activity   Alcohol use: Yes    Alcohol/week: 0.0 standard drinks of alcohol    Comment: 10/17/2015 "might have a couple drinks/month"   Drug use: No   Sexual activity: Never    Partners: Male  Other Topics Concern   Not on file  Social History Narrative   Drinks about 1 cup of coffee a day    Social Determinants of Health   Financial Resource Strain: Not on file  Food Insecurity: Not on file  Transportation Needs: Not on file  Physical Activity: Not on file  Stress: Not on file  Social Connections: Not on file  Intimate Partner Violence: Not on file      PHYSICAL EXAM  There were no vitals filed  for this visit.  There is no height or weight on file to calculate BMI.  Generalized: Well developed, in no acute distress  Chest: Lungs clear to auscultation bilaterally  Neurological examination  Mentation: Alert oriented to time, place, history taking. Follows all commands speech and language fluent Cranial nerve II-XII: Extraocular movements were full, visual field were full on confrontational test Head turning and shoulder shrug  were normal and symmetric. Motor: The motor testing reveals 5 over 5 strength of all 4 extremities. Good symmetric motor tone is noted throughout.  Sensory: Sensory testing is intact to soft touch on all 4 extremities. No evidence of extinction is noted.  Gait and station: Gait is normal.    DIAGNOSTIC DATA (LABS, IMAGING, TESTING) - I reviewed patient records, labs, notes, testing and imaging myself where available.  Lab Results  Component Value Date   WBC 14.2 (H) 10/17/2015   HGB 12.1 10/17/2015   HCT 36.8 10/17/2015   MCV 89.8 10/17/2015   PLT 259 10/17/2015      Component Value Date/Time   NA 141 10/13/2015 1037   K 4.1 10/13/2015 1037   CL 108 10/13/2015 1037   CO2 26 10/13/2015 1037   GLUCOSE 108 (H) 10/13/2015 1037   BUN 11 10/13/2015 1037   CREATININE 0.71 10/17/2015 1434   CREATININE 0.71 03/12/2014 1421   CALCIUM 9.5 10/13/2015 1037   PROT 6.9 03/12/2014 1421   ALBUMIN 4.0 03/12/2014 1421   AST 21 03/12/2014 1421   ALT 24 03/12/2014 1421   ALKPHOS 91 03/12/2014 1421   BILITOT 0.3 03/12/2014 1421   GFRNONAA >60 10/17/2015 1434   GFRNONAA 83 12/19/2013 0845   GFRAA >60 10/17/2015 1434   GFRAA >89 12/19/2013 0845   No results found for: "CHOL", "HDL", "LDLCALC", "LDLDIRECT", "TRIG", "CHOLHDL" Lab Results  Component Value Date   HGBA1C 6.6 (H) 12/19/2013   No results found for: "VITAMINB12" No results found for: "TSH"    ASSESSMENT AND PLAN 71 y.o. year old female  has a past medical history of Allergy, Constipation,  Fibromyalgia, Goiter, Hyperlipemia, Morbid obesity ([place]), OSA on CPAP, Seasonal allergies, and Vitamin D deficiency. here with:  OSA on CPAP  - CPAP compliance excellent - Good treatment of AHI  - Encourage patient to use CPAP nightly and > 4 hours each night -Discussed inspire however the patient's BMI disqualifies her.  The patient is not interested in inspire at this time regardless. - F/U in 1  year or sooner if needed     Ward Givens, MSN, NP-C 06/13/2022, 3:55 PM [facility] [address] [place], [place] [address] [phone]

## 2022-06-14 ENCOUNTER — Ambulatory Visit (INDEPENDENT_AMBULATORY_CARE_PROVIDER_SITE_OTHER): Payer: Medicare Other | Admitting: Adult Health

## 2022-06-14 ENCOUNTER — Encounter: Payer: Self-pay | Admitting: Adult Health

## 2022-06-14 ENCOUNTER — Telehealth: Payer: Self-pay | Admitting: *Deleted

## 2022-06-14 VITALS — BP 131/73 | HR 73 | Ht 66.0 in | Wt 222.8 lb

## 2022-06-14 DIAGNOSIS — G4733 Obstructive sleep apnea (adult) (pediatric): Secondary | ICD-10-CM

## 2022-06-14 DIAGNOSIS — Z9989 Dependence on other enabling machines and devices: Secondary | ICD-10-CM

## 2022-06-14 NOTE — Telephone Encounter (Addendum)
Called pt and LVM (ok per DPR) advising that [facility] NP did order her a new machine but we will also do an HST. Advised pt that when she does her HST she should not wear any CPAP that night. Left office number and asked for call back to confirm receipt of this message.    ----- Message from Ward Givens, NP sent at 06/14/2022 11:43 AM EDT ----- Please call patient and let her know that we will do HST but I have already ordered her a new machine. Advise to no use PAP when doing HST  Megan ----- Message ----- From: Star Age, MD Sent: 06/14/2022   9:15 AM EDT To: Ward Givens, NP  Since testing was over 7 y ago, pls do HST. Please remind pt to do HST without PAP that night. Sa  ----- Message ----- From: Ward Givens, NP Sent: 06/14/2022   9:06 AM EDT To: Star Age, MD  For this patient I went ahead and ordered a new machine as hers no longer works and she does not like to sleep without the CPAP.  Do you want me to also order a Home sleep study.  There is been no changes in her medical history according to the patient

## 2022-06-14 NOTE — Patient Instructions (Signed)

## 2022-06-14 NOTE — Telephone Encounter (Signed)
Received message from phone staff. Pt aware HST has been ordered and she should not wear CPAP the night of the study. She will await a call to schedule. She also wants to keep [employer] as her DME company.

## 2022-06-15 ENCOUNTER — Telehealth: Payer: Self-pay | Admitting: Adult Health

## 2022-06-15 NOTE — Telephone Encounter (Signed)
HST- Medicare/[employer] no auth req.  Patient is scheduled at [facility] for 07/06/22 at 1:00 pm.  Mailed packet to the patient.

## 2022-06-29 ENCOUNTER — Telehealth: Payer: Self-pay | Admitting: Adult Health

## 2022-06-29 NOTE — Telephone Encounter (Signed)
Pt was scheduled for her initial CPAP on (08-11-22) Pt was informed to bring machine and power cord to the appointment.   DME and between dates are in pt's SnapShot.

## 2022-07-05 ENCOUNTER — Other Ambulatory Visit: Payer: Self-pay | Admitting: Family Medicine

## 2022-07-05 DIAGNOSIS — Z1231 Encounter for screening mammogram for malignant neoplasm of breast: Secondary | ICD-10-CM

## 2022-07-06 ENCOUNTER — Ambulatory Visit (INDEPENDENT_AMBULATORY_CARE_PROVIDER_SITE_OTHER): Payer: Medicare Other | Admitting: Neurology

## 2022-07-06 DIAGNOSIS — G4733 Obstructive sleep apnea (adult) (pediatric): Secondary | ICD-10-CM | POA: Diagnosis not present

## 2022-07-08 NOTE — Procedures (Signed)
   [facility]  HOME SLEEP TEST (Watch PAT) REPORT  STUDY DATE: 07/06/2022  DOB: 1951/06/16  MRN: [id number]  ORDERING CLINICIAN: Star Age, MD, PhD   REFERRING CLINICIAN: Ward Givens, NP  CLINICAL INFORMATION/HISTORY: 71 year old female with an underlying medical history of vitamin D deficiency, hypertension, hyperlipidemia, and obesity, who presents for evaluation of her obstructive sleep apnea.  She has been on CPAP therapy.  She qualifies for new equipment.  Epworth sleepiness score: 4/24.  BMI: 39.7 kg/m  FINDINGS:   Sleep Summary:   Total Recording Time (hours, min): 9 hours, 57 min  Total Sleep Time (hours, min):  8 hours, 54 min  Percent REM (%):    22.9%   Respiratory Indices:   Calculated pAHI (per hour):  9.8/hour         REM pAHI:    20.5/hour       NREM pAHI: 6.7/hour  Central pAHI: 0/hour  Oxygen Saturation Statistics:    Oxygen Saturation (%) Mean: 94%   Minimum oxygen saturation (%):                 87%   O2 Saturation Range (%): 87-100%    O2 Saturation (minutes) <=88%: 0 min  Pulse Rate Statistics:   Pulse Mean (bpm):    79/min    Pulse Range (58-109/min)   IMPRESSION: OSA (obstructive sleep apnea)   RECOMMENDATION:  This home sleep test demonstrates overall mild obstructive sleep apnea with a total AHI of 9.8/hour and O2 nadir of 87%.  Intermittent mild to moderate snoring was detected.  The patient has been stable on CPAP therapy and compliant with treatment.  Ongoing treatment with positive airway pressure is recommended.  She will be advised to proceed with AutoPap therapy at home with new equipment, pressure range of 5 to 9 cm via mask of choice. A full night, in-lab PAP titration study may aid in improving proper treatment settings and with mask fit, if needed, down the road. Alternative treatments may include weight loss (where appropriate) along with avoidance of the supine sleep position (if possible), or an  oral appliance in appropriate candidates.   Please note that untreated obstructive sleep apnea may carry additional perioperative morbidity. Patients with significant obstructive sleep apnea should receive perioperative PAP therapy and the surgeons and particularly the anesthesiologist should be informed of the diagnosis and the severity of the sleep disordered breathing. The patient should be cautioned not to drive, work at heights, or operate dangerous or heavy equipment when tired or sleepy. Review and reiteration of good sleep hygiene measures should be pursued with any patient. Other causes of the patient's symptoms, including circadian rhythm disturbances, an underlying mood disorder, medication effect and/or an underlying medical problem cannot be ruled out based on this test. Clinical correlation is recommended.  The patient and her referring provider will be notified of the test results. The patient will be seen in follow up in sleep clinic at [facility], as necessary.  I certify that I have reviewed the raw data recording prior to the issuance of this report in accordance with the standards of the American Academy of Sleep Medicine (AASM).  INTERPRETING PHYSICIAN:   Star Age, MD, PhD  Board Certified in Neurology and Sleep Medicine  [facility] [address], [place], [place] [address] [phone]

## 2022-07-08 NOTE — Addendum Note (Signed)
Addended by: Star Age on: 07/08/2022 06:42 PM   Modules accepted: Orders

## 2022-07-08 NOTE — Progress Notes (Signed)
See procedure note.

## 2022-07-20 ENCOUNTER — Telehealth: Payer: Self-pay | Admitting: *Deleted

## 2022-07-20 DIAGNOSIS — G4733 Obstructive sleep apnea (adult) (pediatric): Secondary | ICD-10-CM

## 2022-07-20 NOTE — Telephone Encounter (Signed)
Called pt & LVM (ok per DPR) asking for call back to discuss sleep study results. Pt should be eligible for new equipment. Need to know if she wants to keep her same DME company or switch. Left office number for call back.

## 2022-07-20 NOTE — Telephone Encounter (Signed)
-----   Message from Star Age, MD sent at 07/08/2022  6:42 PM EDT ----- Patient had a home sleep test on 07/06/2022 for reevaluation of her OSA, she has been on home sleep CPAP therapy for years.  She should qualify for new equipment.  Please advise patient that she can start treatment with a new AutoPap machine to her DME company.  An order has been placed in her chart.  She will need a follow-up within 3 months after starting treatment with new equipment.  She can see Jinny Blossom or myself.

## 2022-07-26 DIAGNOSIS — E559 Vitamin D deficiency, unspecified: Secondary | ICD-10-CM | POA: Diagnosis not present

## 2022-07-28 NOTE — Telephone Encounter (Signed)
Reviewed chart. Provider had ordered a new machine and HST previously. The patient received a new machine on 06/24/22 and her initial f/u appt is scheduled 08/11/22. She now needs a setting change to autopap. I called [employer] and spoke with Tammy who clarified w/ RT that, per insurance, patient does not have to wait an additional 30 days to follow-up after this setting change. Her compliance visit on 9/27 will suffice for machine usage in general. I sent the order for the autopap settings over to [employer].    Called pt & LVM asking for call back.

## 2022-07-28 NOTE — Telephone Encounter (Signed)
Spoke with Dr Rexene Alberts and reviewed pt's download on current CPAP setting 7 cm. Download looks great. Dr Rexene Alberts is ok with patient staying at CPAP setting rather than switching to autopap if patient is ok and feels she is personally tolerating the CPAP setting well. I spoke with the patient. She is happy with the current setting and prefers to leave as is. I told her I would let [employer] know. She will f/u for initial compliance visit on 9/27 at 9:45 AM arrival 9:15 AM as scheduled. She verbalized appreciation and her questions were answered.   Message sent to [employer] to leave at CPAP 7 cm. Mask of choice.

## 2022-07-28 NOTE — Addendum Note (Signed)
Addended by: Gildardo Griffes on: 07/28/2022 10:36 AM   Modules accepted: Orders

## 2022-07-29 ENCOUNTER — Ambulatory Visit
Admission: RE | Admit: 2022-07-29 | Discharge: 2022-07-29 | Disposition: A | Discharge status: Home / Self Care | Payer: Medicare Other | Source: Ambulatory Visit | Attending: Family Medicine | Admitting: Family Medicine

## 2022-07-29 DIAGNOSIS — Z1231 Encounter for screening mammogram for malignant neoplasm of breast: Secondary | ICD-10-CM

## 2022-08-11 ENCOUNTER — Ambulatory Visit (INDEPENDENT_AMBULATORY_CARE_PROVIDER_SITE_OTHER): Payer: Medicare Other | Admitting: Neurology

## 2022-08-11 ENCOUNTER — Encounter: Payer: Self-pay | Admitting: Neurology

## 2022-08-11 VITALS — BP 118/67 | HR 70 | Ht 66.0 in | Wt 221.2 lb

## 2022-08-11 DIAGNOSIS — Z9989 Dependence on other enabling machines and devices: Secondary | ICD-10-CM

## 2022-08-11 DIAGNOSIS — G4733 Obstructive sleep apnea (adult) (pediatric): Secondary | ICD-10-CM | POA: Diagnosis not present

## 2022-08-11 NOTE — Progress Notes (Signed)
Subjective:    Patient ID: Rachel Hess is a 71 y.o. female.  HPI    Interim history:   Ms. Castles is a 71 year old right-handed woman with an underlying medical history of vitamin D deficiency, hypertension, hyperlipidemia, and morbid obesity, who presents for follow-up consultation of her obstructive sleep apnea, after Interim testing and starting treatment with a new CPAP machine.  She had a home sleep test on  07/06/2022 which showed an AHI of 9.8/h, O2 nadir 87%, intermittent mild to moderate snoring was detected.  I prescribed a new CPAP machine as her machine was older, from 2016.  Her set up date was 06/24/2022.  She has a ResMed air sense 11 AutoSet machine.  Today, 08/11/2022: I reviewed her CPAP compliance data from 07/11/2022 through 08/09/2022, which is a total of 30 days, during which time she used her machine 29 days with percent use days greater than 4 hours at 97%, indicating excellent compliance with an average usage of 9 hours and 46 minutes, residual AHI at goal at 0.7/h, leak on the low side with the 95th percentile at 2.7 L/min on a pressure of 7 cm with EPR of 3.  I last saw her on 04/24/2018, at which time she was compliant with her CPAP of 7 cm.   She reports doing well with her new CPAP, nasal pillows work well for her. Send a chinstrap which she does not meet.  She uses her nasal pillows size large, ResMed P10 without a chinstrap.  She does need a new headgear but generally is up-to-date with her supplies otherwise.  She continues to benefit from treatment and has had no issues adjusting to the new machine.   The patient's allergies, current medications, family history, past medical history, past social history, past surgical history and problem list were reviewed and updated as appropriate.    Previously:  She saw Ward Givens on 06/14/22, at which time she was compliant with CPAP. Her machine indicated that the motor life had been exceeded.   She saw  Ward Givens on 11/18/21, at which time she was fully compliant with CPAP. She was interested in [place].  She had a phone VV with Ward Givens on 04/30/19, at which time was compliant with her CPAP and doing well. She requested to try lg nasal pillows.   I reviewed her CPAP compliance data from 03/21/2018 04/19/2018 which is a total of 30 days, during which time she used her CPAP every night with percent used days greater than 4 hours at 97%, indicating excellent compliance with an average usage of 8 hours and 2 minutes, residual AHI at goal at 0.5 per hour, leak on the lower end with the 95th percentile at 5.6 L/m on a pressure of 7 cm with EPR of 3.   I first met her on 07/28/2015, at which time she reported a prior diagnosis of sleep apnea. She had a sleep study in April 2015 which showed an AHI of 16.4 per hour, O2 nadir was 85%. I suggested we proceed with a CPAP titration study. She had this on 08/27/2015. Sleep efficiency was 69.9%, sleep latency 6.5 minutes, wake after sleep onset 107 minutes. She had an increased percentage of stage II sleep, a decreased percentage of slow-wave sleep and a mildly decreased percentage of REM sleep. She was titrated on CPAP from 5 cm to 7 cm. On the final pressure her AHI was 0 per hour with REM sleep achieved, O2 nadir for the study was 88%.  Based on her test results I started her on CPAP therapy at home with a pressure of 7 cm.    07/28/2015: (She) reports snoring and excessive daytime somnolence. I reviewed your office note from 04/02/2015 which you kindly included. She also had blood work through your office on 04/02/2015: Free T3 was normal, TSH normal, free T4 normal. She had a baseline sleep study and [facility] on 02/27/2014 which I reviewed: Study was interpreted by Dr. Baird Lyons. She weighed 240 pounds at the time with a BMI of 38.7. Neck circumference was documented at 15 inches. She had a sleep efficiency of 87.6%. She had absence of  slow-wave sleep and REM sleep at 28.7% with a REM latency of 66.5 minutes. Arousal index was 17.3 per hour. She had a total AHI of 16.4 per hour, REM AHI was 42.9 per hour. She had moderate to loud snoring. Oxygen saturation on average was 93.2%, nadir was 85%. She had PVCs on EKG. She was advised to proceed with a CPAP titration study but did not proceed due to cost. She would be willing to go on CPAP therapy. Sadly, she lost 2 brothers with the last 6 weeks. One brother had complications from his diabetes, the other had a massive stroke. Her main complaint about her sleep is sleep disruption, and nonrestorative sleep. Her Epworth sleepiness score is 2 out of 24, her fatigue score is 17 out of 63. She denies a family history of obstructive sleep apnea, but is not completely sure. She is a side sleeper. She denies any frank restless leg symptoms and leg twitching at night. She is trying to lose weight. Since her daytime sleep study from April 2015, she has lost about 8 pounds. She has had a goiter. She had a thyroid ultrasound guided I see on 07/02/2015 but results are pending. She is waiting for her test results and has called your office a couple of times she states.  she reports occasional morning headaches about once every 2 weeks or so. She has nocturia about once per night on average. She works full-time as an Software engineer for [employer], working for Clinical biochemist of transportation. She has 1 grandson and 2 granddaughters. She quit smoking in 1990, she drinks one cup of coffee per day, she drinks alcohol very occasionally. She lives alone and has done so since 1990. Her Past Medical History Is Significant For: Past Medical History:  Diagnosis Date   Allergy    "sinuses"   Constipation    Fibromyalgia    Goiter    Hyperlipemia    Morbid obesity ([place])    OSA on CPAP    Seasonal allergies    Vitamin D deficiency     Her Past Surgical History Is Significant For: Past Surgical  History:  Procedure Laterality Date   CARPAL TUNNEL RELEASE Left    cataract     surgery both eyes   CHOLECYSTECTOMY OPEN     COLONOSCOPY W/ POLYPECTOMY     THYROID LOBECTOMY Left 10/17/2015   THYROIDECTOMY Left 10/17/2015   Procedure: LEFT THYROIDECTOMY ;  Surgeon: Jerrell Belfast, MD;  Location: [facility] OR;  Service: ENT;  Laterality: Left;   TONSILLECTOMY     TUBAL LIGATION     VAGINAL HYSTERECTOMY      Her Family History Is Significant For: Family History  Problem Relation Age of Onset   Heart disease Mother    Cancer Father    Sleep apnea Neg Hx  Her Social History Is Significant For: Social History   Socioeconomic History   Marital status: Divorced    Spouse name: Freddie   Number of children: 1   Years of education: 12   Highest education level: Not on file  Occupational History   Not on file  Tobacco Use   Smoking status: Former    Packs/day: 0.50    Years: 25.00    Total pack years: 12.50    Types: Cigarettes   Smokeless tobacco: Never   Tobacco comments:    Quit smoking cigarettes in the 1990  Vaping Use   Vaping Use: Never used  Substance and Sexual Activity   Alcohol use: Yes    Alcohol/week: 0.0 standard drinks of alcohol    Comment: 10/17/2015 "might have a couple drinks/month"   Drug use: No   Sexual activity: Never    Partners: Male  Other Topics Concern   Not on file  Social History Narrative   Drinks about 1 cup of coffee a day    Social Determinants of Health   Financial Resource Strain: Not on file  Food Insecurity: Not on file  Transportation Needs: Not on file  Physical Activity: Not on file  Stress: Not on file  Social Connections: Not on file    Her Allergies Are:  No Known Allergies:   Her Current Medications Are:  Outpatient Encounter Medications as of 08/11/2022  Medication Sig   fexofenadine-pseudoephedrine (ALLEGRA-D 24) 180-240 MG per 24 hr tablet Take 1 tablet by mouth daily as needed.    glipiZIDE (GLUCOTROL XL) 10  MG 24 hr tablet Take 10 mg by mouth 2 (two) times daily.   hydrochlorothiazide (HYDRODIURIL) 25 MG tablet Take 25 mg by mouth daily.    ibuprofen (ADVIL,MOTRIN) 800 MG tablet Take 800 mg by mouth every 8 (eight) hours as needed for mild pain or moderate pain.    tirzepatide (MOUNJARO) 10 MG/0.5ML Pen Inject 2.5 mg as directed once a week.   metFORMIN (GLUCOPHAGE) 500 MG tablet Take 500 mg by mouth 2 (two) times daily with a meal.   No facility-administered encounter medications on file as of 08/11/2022.  :  Review of Systems:  Out of a complete 14 point review of systems, all are reviewed and negative with the exception of these symptoms as listed below:  Review of Systems  Neurological:        Pt here for CPAP f/u  Pt states doing well on CPAP machine Pt is complaining about DME sending her the wrong  chin strap for her head gear Pt states has spoke to DME several times  and went to [employer] to tell them but still doesn't send correct chin strap    ESS:2    Objective:  Neurological Exam  Physical Exam Physical Examination:   Vitals:   08/11/22 0935  BP: 118/67  Pulse: 70    General Examination: The patient is a very pleasant 71 y.o. female in no acute distress. She appears well-developed and well groomed.   HEENT: Normocephalic, atraumatic, pupils are equal, round and reactive to light, tracking well-preserved, corrective eyeglasses in place.  Hearing grossly intact, face is symmetric with normal facial animation, speech is clear without dysarthria, hypophonia or voice tremor.  No head tremor.  Airway examination reveals no significant mouth dryness, stable findings, tongue protrudes centrally and palate elevates symmetrically.     Chest: Clear to auscultation without wheezing, rhonchi or crackles noted.   Heart: S1+S2+0, regular and normal without murmurs,  rubs or gallops noted.    Abdomen: Soft, non-tender and non-distended.   Extremities: There is no obvious edema.   Skin:  Warm and dry without trophic changes noted. There are no varicose veins.   Musculoskeletal: exam reveals no obvious joint deformities.    Neurologically:  Mental status: The patient is awake, alert and oriented in all 4 spheres. Her immediate and remote memory, attention, language skills and fund of knowledge are appropriate. There is no evidence of aphasia, agnosia, apraxia or anomia. Speech is clear with normal prosody and enunciation. Thought process is linear. Mood is normal and affect is normal.  Cranial nerves II - XII are as described above under HEENT exam.  Motor exam: Normal bulk, strength and tone is noted. There is no obvious tremor. Fine motor skills and coordination: grossly intact.  Cerebellar testing: No dysmetria or intention tremor.  Sensory exam: intact to light touch.  Gait, station and balance: She stands easily. No veering to one side is noted. No leaning to one side is noted. Posture is age-appropriate and stance is narrow based. Gait shows normal stride length and normal pace.    Assessment and Plan:    In summary, VALESKA HAISLIP is a very pleasant 71 year old right-handed woman with an underlying medical history of vitamin D deficiency, hypertension, hyperlipidemia, and morbid obesity, who presents for follow-up consultation of her obstructive sleep apnea, after interim testing and starting treatment with a new CPAP machine on 06/24/2022.  She had a home sleep test on  07/06/2022 which showed an AHI of 9.8/h, O2 nadir 87%, intermittent mild to moderate snoring was detected.  Her previous machine was issued to her in 2016.  She has adjusted well to her new machine and is compliant with treatment, continues to use large nasal pillows.  I prescribed a new headgear and advised her DME provider that she does not need a chinstrap.  She is advised to follow-up routinely in this clinic to see Vaughan Browner, NP in 1 year, sooner if needed.  She is commended for her treatment  adherence.  Her apnea score looks good, leak on the low side.  I answered all her questions today and she was in agreement with our plan.  I spent 30 minutes in total face-to-face time and in reviewing records during pre-charting, more than 50% of which was spent in counseling and coordination of care, reviewing test results, reviewing medications and treatment regimen and/or in discussing or reviewing the diagnosis of OSA, the prognosis and treatment options. Pertinent laboratory and imaging test results that were available during this visit with the patient were reviewed by me and considered in my medical decision making (see chart for details).

## 2022-08-11 NOTE — Patient Instructions (Signed)

## 2022-08-17 DIAGNOSIS — L509 Urticaria, unspecified: Secondary | ICD-10-CM | POA: Diagnosis not present

## 2022-11-22 ENCOUNTER — Encounter: Payer: Self-pay | Admitting: *Deleted

## 2022-11-22 NOTE — Progress Notes (Signed)
PATIENT: [place] DOB: 1951-09-12  REASON FOR VISIT: follow up HISTORY FROM: patient PRIMARY NEUROLOGIST:   Virtual Visit via Video Note  I connected with [place] on 11/23/22 at  2:30 PM EST by a video enabled telemedicine application located remotely at [facility] and verified that I am speaking with the correct person using two identifiers who was located at their own home.   I discussed the limitations of evaluation and management by telemedicine and the availability of in person appointments. The patient expressed understanding and agreed to proceed.   PATIENT: Rachel Hess DOB: 1951/01/02  REASON FOR VISIT: follow up HISTORY FROM: patient  HISTORY OF PRESENT ILLNESS: Today 11/23/22    REVIEW OF SYSTEMS: Out of a complete 14 system review of symptoms, the patient complains only of the following symptoms, and all other reviewed systems are negative.  ALLERGIES: No Known Allergies  HOME MEDICATIONS: Outpatient Medications Prior to Visit  Medication Sig Dispense Refill   fexofenadine-pseudoephedrine (ALLEGRA-D 24) 180-240 MG per 24 hr tablet Take 1 tablet by mouth daily as needed.      glipiZIDE (GLUCOTROL XL) 10 MG 24 hr tablet Take 10 mg by mouth 2 (two) times daily.     hydrochlorothiazide (HYDRODIURIL) 25 MG tablet Take 25 mg by mouth daily.      ibuprofen (ADVIL,MOTRIN) 800 MG tablet Take 800 mg by mouth every 8 (eight) hours as needed for mild pain or moderate pain.      metFORMIN (GLUCOPHAGE) 500 MG tablet Take 500 mg by mouth 2 (two) times daily with a meal.     tirzepatide (MOUNJARO) 10 MG/0.5ML Pen Inject 2.5 mg as directed once a week.     No facility-administered medications prior to visit.    PAST MEDICAL HISTORY: Past Medical History:  Diagnosis Date   Allergy    "sinuses"   Constipation    Fibromyalgia    Goiter    Hyperlipemia    Morbid obesity ([place])    OSA on CPAP    Seasonal  allergies    Vitamin D deficiency     PAST SURGICAL HISTORY: Past Surgical History:  Procedure Laterality Date   CARPAL TUNNEL RELEASE Left    cataract     surgery both eyes   CHOLECYSTECTOMY OPEN     COLONOSCOPY W/ POLYPECTOMY     THYROID LOBECTOMY Left 10/17/2015   THYROIDECTOMY Left 10/17/2015   Procedure: LEFT THYROIDECTOMY ;  Surgeon: Jerrell Belfast, MD;  Location: [facility] OR;  Service: ENT;  Laterality: Left;   TONSILLECTOMY     TUBAL LIGATION     VAGINAL HYSTERECTOMY      FAMILY HISTORY: Family History  Problem Relation Age of Onset   Heart disease Mother    Cancer Father    Sleep apnea Neg Hx     SOCIAL HISTORY: Social History   Socioeconomic History   Marital status: Divorced    Spouse name: Freddie   Number of children: 1   Years of education: 12   Highest education level: Not on file  Occupational History   Not on file  Tobacco Use   Smoking status: Former    Packs/day: 0.50    Years: 25.00    Total pack years: 12.50    Types: Cigarettes   Smokeless tobacco: Never   Tobacco comments:    Quit smoking cigarettes in the 1990  Vaping Use   Vaping Use: Never used  Substance and Sexual Activity  Alcohol use: Yes    Alcohol/week: 0.0 standard drinks of alcohol    Comment: 10/17/2015 "might have a couple drinks/month"   Drug use: No   Sexual activity: Never    Partners: Male  Other Topics Concern   Not on file  Social History Narrative   Drinks about 1 cup of coffee a day    Social Determinants of Health   Financial Resource Strain: Not on file  Food Insecurity: Not on file  Transportation Needs: Not on file  Physical Activity: Not on file  Stress: Not on file  Social Connections: Not on file  Intimate Partner Violence: Not on file      PHYSICAL EXAM Generalized: Well developed, in no acute distress   Neurological examination  Mentation: Alert oriented to time, place, history taking. Follows all commands speech and language fluent Cranial  nerve II-XII:Extraocular movements were full. Facial symmetry noted. uvula tongue midline. Head turning and shoulder shrug  were normal and symmetric. Motor: Good strength throughout subjectively per patient Sensory: Sensory testing is intact to soft touch on all 4 extremities subjectively per patient Coordination: Cerebellar testing reveals good finger-nose-finger  Gait and station: Patient is able to stand from a seated position. gait is normal.  Reflexes: UTA  DIAGNOSTIC DATA (LABS, IMAGING, TESTING) - I reviewed patient records, labs, notes, testing and imaging myself where available.  Lab Results  Component Value Date   WBC 14.2 (H) 10/17/2015   HGB 12.1 10/17/2015   HCT 36.8 10/17/2015   MCV 89.8 10/17/2015   PLT 259 10/17/2015      Component Value Date/Time   NA 141 10/13/2015 1037   K 4.1 10/13/2015 1037   CL 108 10/13/2015 1037   CO2 26 10/13/2015 1037   GLUCOSE 108 (H) 10/13/2015 1037   BUN 11 10/13/2015 1037   CREATININE 0.71 10/17/2015 1434   CREATININE 0.71 03/12/2014 1421   CALCIUM 9.5 10/13/2015 1037   PROT 6.9 03/12/2014 1421   ALBUMIN 4.0 03/12/2014 1421   AST 21 03/12/2014 1421   ALT 24 03/12/2014 1421   ALKPHOS 91 03/12/2014 1421   BILITOT 0.3 03/12/2014 1421   GFRNONAA >60 10/17/2015 1434   GFRNONAA 83 12/19/2013 0845   GFRAA >60 10/17/2015 1434   GFRAA >89 12/19/2013 0845   No results found for: "CHOL", "HDL", "LDLCALC", "LDLDIRECT", "TRIG", "CHOLHDL" Lab Results  Component Value Date   HGBA1C 6.6 (H) 12/19/2013   No results found for: "VITAMINB12" No results found for: "TSH"    ASSESSMENT AND PLAN 72 y.o. year old female  has a past medical history of Allergy, Constipation, Fibromyalgia, Goiter, Hyperlipemia, Morbid obesity ([place]), OSA on CPAP, Seasonal allergies, and Vitamin D deficiency. here with:  OSA on CPAP  CPAP compliance excellent Residual AHI is good Encouraged patient to continue using CPAP nightly and > 4 hours each night F/U  in 1 year or sooner if needed  I spent 20 minutes of face-to-face and non-face-to-face time with patient.  This included previsit chart review, lab review, study review, order entry, electronic health record documentation, patient education.  Ward Givens, MSN, NP-C 11/23/2022, 2:37 PM [facility] [address] [place], [place] [address] [phone]

## 2022-11-23 ENCOUNTER — Encounter: Payer: Medicare Other | Admitting: Adult Health

## 2022-12-15 NOTE — Progress Notes (Signed)
This encounter was created in error - please disregard.

## 2023-01-19 ENCOUNTER — Ambulatory Visit: Payer: 59 | Admitting: Adult Health

## 2023-02-02 NOTE — Progress Notes (Unsigned)
PATIENT: Rachel Hess DOB: 1951-07-26  REASON FOR VISIT: follow up HISTORY FROM: patient PRIMARY NEUROLOGIST: Dr. Frances Furbish  Chief Complaint  Patient presents with   Follow-up    Rm 18, alone, sleep apnea, cpap compliance good, no questions/concerns     HISTORY OF PRESENT ILLNESS: Today 02/03/23:  Rachel Hess is a 72 y.o. female with a history of OSA on CPAP. Returns today for follow-up. She reports that CPAP is working well. No new issues. DL is below.         REVIEW OF SYSTEMS: Out of a complete 14 system review of symptoms, the patient complains only of the following symptoms, and all other reviewed systems are negative.   ESS1  ALLERGIES: No Known Allergies  HOME MEDICATIONS: Outpatient Medications Prior to Visit  Medication Sig Dispense Refill   fexofenadine-pseudoephedrine (ALLEGRA-D 24) 180-240 MG per 24 hr tablet Take 1 tablet by mouth daily as needed.      glipiZIDE (GLUCOTROL XL) 10 MG 24 hr tablet Take 10 mg by mouth 2 (two) times daily.     hydrochlorothiazide (HYDRODIURIL) 25 MG tablet Take 25 mg by mouth daily.      ibuprofen (ADVIL,MOTRIN) 800 MG tablet Take 800 mg by mouth every 8 (eight) hours as needed for mild pain or moderate pain.      tirzepatide (MOUNJARO) 10 MG/0.5ML Pen Inject 2.5 mg as directed once a week.     acetaminophen (TYLENOL 8 HOUR ARTHRITIS PAIN) 650 MG CR tablet Take 650 mg by mouth every 8 (eight) hours as needed for pain.     metFORMIN (GLUCOPHAGE) 500 MG tablet Take 500 mg by mouth 2 (two) times daily with a meal. (Patient not taking: Reported on 02/03/2023)     No facility-administered medications prior to visit.    PAST MEDICAL HISTORY: Past Medical History:  Diagnosis Date   Allergy    "sinuses"   Constipation    Fibromyalgia    Goiter    Hyperlipemia    Morbid obesity ([facility])    OSA on CPAP    Seasonal allergies    Vitamin D deficiency     PAST SURGICAL HISTORY: Past Surgical History:   Procedure Laterality Date   CARPAL TUNNEL RELEASE Left    cataract     surgery both eyes   CHOLECYSTECTOMY OPEN     COLONOSCOPY W/ POLYPECTOMY     THYROID LOBECTOMY Left 10/17/2015   THYROIDECTOMY Left 10/17/2015   Procedure: LEFT THYROIDECTOMY ;  Surgeon: Osborn Coho, MD;  Location: [facility] OR;  Service: ENT;  Laterality: Left;   TONSILLECTOMY     TUBAL LIGATION     VAGINAL HYSTERECTOMY      FAMILY HISTORY: Family History  Problem Relation Age of Onset   Heart disease Mother    Cancer Father    Sleep apnea Neg Hx     SOCIAL HISTORY: Social History   Socioeconomic History   Marital status: Divorced    Spouse name: Freddie   Number of children: 1   Years of education: 12   Highest education level: Not on file  Occupational History   Not on file  Tobacco Use   Smoking status: Former    Packs/day: 0.50    Years: 25.00    Additional pack years: 0.00    Total pack years: 12.50    Types: Cigarettes   Smokeless tobacco: Never   Tobacco comments:    Quit smoking cigarettes in the 1990  Vaping Use  Vaping Use: Never used  Substance and Sexual Activity   Alcohol use: Yes    Alcohol/week: 0.0 standard drinks of alcohol    Comment: 10/17/2015 "might have a couple drinks/month"   Drug use: No   Sexual activity: Never    Partners: Male  Other Topics Concern   Not on file  Social History Narrative   Drinks about 1 cup of coffee a day    Social Determinants of Health   Financial Resource Strain: Not on file  Food Insecurity: Not on file  Transportation Needs: Not on file  Physical Activity: Not on file  Stress: Not on file  Social Connections: Not on file  Intimate Partner Violence: Not on file      PHYSICAL EXAM  Vitals:   02/03/23 0824  Height: 5\' 5"  (1.651 m)   Body mass index is 36.81 kg/m.  Generalized: Well developed, in no acute distress  Chest: Lungs clear to auscultation bilaterally  Neurological examination  Mentation: Alert oriented to  time, place, history taking. Follows all commands speech and language fluent Cranial nerve II-XII: Facial symmetry noted Gait and station: Gait is normal.    DIAGNOSTIC DATA (LABS, IMAGING, TESTING) - I reviewed patient records, labs, notes, testing and imaging myself where available.  Lab Results  Component Value Date   WBC 14.2 (H) 10/17/2015   HGB 12.1 10/17/2015   HCT 36.8 10/17/2015   MCV 89.8 10/17/2015   PLT 259 10/17/2015      Component Value Date/Time   NA 141 10/13/2015 1037   K 4.1 10/13/2015 1037   CL 108 10/13/2015 1037   CO2 26 10/13/2015 1037   GLUCOSE 108 (H) 10/13/2015 1037   BUN 11 10/13/2015 1037   CREATININE 0.71 10/17/2015 1434   CREATININE 0.71 03/12/2014 1421   CALCIUM 9.5 10/13/2015 1037   PROT 6.9 03/12/2014 1421   ALBUMIN 4.0 03/12/2014 1421   AST 21 03/12/2014 1421   ALT 24 03/12/2014 1421   ALKPHOS 91 03/12/2014 1421   BILITOT 0.3 03/12/2014 1421   GFRNONAA >60 10/17/2015 1434   GFRNONAA 83 12/19/2013 0845   GFRAA >60 10/17/2015 1434   GFRAA >89 12/19/2013 0845       ASSESSMENT AND PLAN 72 y.o. year old female  has a past medical history of Allergy, Constipation, Fibromyalgia, Goiter, Hyperlipemia, Morbid obesity ([facility]), OSA on CPAP, Seasonal allergies, and Vitamin D deficiency. here with:  OSA on CPAP  - CPAP compliance excellent - Good treatment of AHI  - Encourage patient to use CPAP nightly and > 4 hours each night - F/U in 1 year or sooner if needed    Butch Penny, MSN, NP-C 02/03/2023, 8:27 AM [facility] [address] [place], [place] [address] [phone]

## 2023-02-03 ENCOUNTER — Ambulatory Visit: Payer: Medicare Other | Admitting: Adult Health

## 2023-02-03 ENCOUNTER — Encounter: Payer: Self-pay | Admitting: Adult Health

## 2023-02-03 VITALS — BP 128/74 | HR 71 | Ht 65.0 in | Wt 239.8 lb

## 2023-02-03 DIAGNOSIS — G4733 Obstructive sleep apnea (adult) (pediatric): Secondary | ICD-10-CM | POA: Diagnosis not present

## 2023-02-03 NOTE — Patient Instructions (Signed)
Your Plan:  Continue     Thank you for coming to see us at [facility]. I hope we have been able to provide you high quality care today.  You may receive a patient satisfaction survey over the next few weeks. We would appreciate your feedback and comments so that we may continue to improve ourselves and the health of our patients.  

## 2023-06-15 ENCOUNTER — Ambulatory Visit: Payer: Medicare HMO | Admitting: Adult Health

## 2023-06-15 ENCOUNTER — Encounter: Payer: Self-pay | Admitting: Adult Health

## 2023-06-15 VITALS — BP 118/68 | HR 70 | Ht 66.0 in | Wt 239.0 lb

## 2023-06-15 DIAGNOSIS — G4733 Obstructive sleep apnea (adult) (pediatric): Secondary | ICD-10-CM | POA: Diagnosis not present

## 2023-06-15 NOTE — Patient Instructions (Signed)
Your Plan:  Continue using CPAP nightly and greater than 4 hours each night If your symptoms worsen or you develop new symptoms please let us know.       Thank you for coming to see us at [facility]. I hope we have been able to provide you high quality care today.  You may receive a patient satisfaction survey over the next few weeks. We would appreciate your feedback and comments so that we may continue to improve ourselves and the health of our patients.  

## 2023-06-27 ENCOUNTER — Other Ambulatory Visit: Payer: Self-pay | Admitting: Family Medicine

## 2023-06-27 DIAGNOSIS — Z1231 Encounter for screening mammogram for malignant neoplasm of breast: Secondary | ICD-10-CM

## 2023-07-13 ENCOUNTER — Ambulatory Visit: Payer: 59 | Admitting: Adult Health

## 2023-07-25 DIAGNOSIS — E785 Hyperlipidemia, unspecified: Secondary | ICD-10-CM | POA: Diagnosis not present

## 2023-07-25 DIAGNOSIS — E559 Vitamin D deficiency, unspecified: Secondary | ICD-10-CM | POA: Diagnosis not present

## 2023-07-25 DIAGNOSIS — E041 Nontoxic single thyroid nodule: Secondary | ICD-10-CM | POA: Diagnosis not present

## 2023-07-25 DIAGNOSIS — M1990 Unspecified osteoarthritis, unspecified site: Secondary | ICD-10-CM | POA: Diagnosis not present

## 2023-07-25 DIAGNOSIS — M179 Osteoarthritis of knee, unspecified: Secondary | ICD-10-CM | POA: Diagnosis not present

## 2023-07-25 DIAGNOSIS — E1140 Type 2 diabetes mellitus with diabetic neuropathy, unspecified: Secondary | ICD-10-CM | POA: Diagnosis not present

## 2023-07-25 DIAGNOSIS — Z13820 Encounter for screening for osteoporosis: Secondary | ICD-10-CM | POA: Diagnosis not present

## 2023-07-25 DIAGNOSIS — I10 Essential (primary) hypertension: Secondary | ICD-10-CM | POA: Diagnosis not present

## 2023-08-01 ENCOUNTER — Ambulatory Visit
Admission: RE | Admit: 2023-08-01 | Discharge: 2023-08-01 | Disposition: A | Discharge status: Home / Self Care | Payer: Medicare HMO | Source: Ambulatory Visit | Attending: Family Medicine | Admitting: Family Medicine

## 2023-08-01 DIAGNOSIS — Z1231 Encounter for screening mammogram for malignant neoplasm of breast: Secondary | ICD-10-CM

## 2023-08-08 DIAGNOSIS — I10 Essential (primary) hypertension: Secondary | ICD-10-CM | POA: Diagnosis not present

## 2023-08-08 DIAGNOSIS — M19042 Primary osteoarthritis, left hand: Secondary | ICD-10-CM | POA: Diagnosis not present

## 2023-08-08 DIAGNOSIS — M179 Osteoarthritis of knee, unspecified: Secondary | ICD-10-CM | POA: Diagnosis not present

## 2023-08-08 DIAGNOSIS — G720 Drug-induced myopathy: Secondary | ICD-10-CM | POA: Diagnosis not present

## 2023-08-08 DIAGNOSIS — M19011 Primary osteoarthritis, right shoulder: Secondary | ICD-10-CM | POA: Diagnosis not present

## 2023-08-08 DIAGNOSIS — E559 Vitamin D deficiency, unspecified: Secondary | ICD-10-CM | POA: Diagnosis not present

## 2023-08-08 DIAGNOSIS — E1140 Type 2 diabetes mellitus with diabetic neuropathy, unspecified: Secondary | ICD-10-CM | POA: Diagnosis not present

## 2023-08-08 DIAGNOSIS — E7800 Pure hypercholesterolemia, unspecified: Secondary | ICD-10-CM | POA: Diagnosis not present

## 2023-08-08 DIAGNOSIS — Z0000 Encounter for general adult medical examination without abnormal findings: Secondary | ICD-10-CM | POA: Diagnosis not present

## 2023-08-10 ENCOUNTER — Ambulatory Visit: Payer: Medicare Other | Admitting: Adult Health

## 2023-08-22 DIAGNOSIS — H524 Presbyopia: Secondary | ICD-10-CM | POA: Diagnosis not present

## 2023-12-29 ENCOUNTER — Ambulatory Visit (INDEPENDENT_AMBULATORY_CARE_PROVIDER_SITE_OTHER): Payer: Medicare Other | Admitting: Podiatry

## 2023-12-29 ENCOUNTER — Encounter: Payer: Self-pay | Admitting: Podiatry

## 2023-12-29 DIAGNOSIS — M79674 Pain in right toe(s): Secondary | ICD-10-CM | POA: Insufficient documentation

## 2023-12-29 DIAGNOSIS — M2010 Hallux valgus (acquired), unspecified foot: Secondary | ICD-10-CM | POA: Diagnosis not present

## 2023-12-29 DIAGNOSIS — M79675 Pain in left toe(s): Secondary | ICD-10-CM | POA: Diagnosis not present

## 2023-12-29 DIAGNOSIS — E1142 Type 2 diabetes mellitus with diabetic polyneuropathy: Secondary | ICD-10-CM

## 2023-12-29 DIAGNOSIS — B351 Tinea unguium: Secondary | ICD-10-CM | POA: Diagnosis not present

## 2023-12-29 NOTE — Progress Notes (Signed)
This patient presents to the office with chief complaint of long thick nails and diabetic feet.  This patient  says there  is  pain and discomfort in their big toes.  This patient says there are long thick painful nails.  These nails are painful walking and wearing shoes.  Patient has no history of infection or drainage from both feet.  Patient is unable to  self treat her own nails . This patient presents  to the office today for treatment of the  long nails and a foot evaluation due to history of  diabetes.  General Appearance  Alert, conversant and in no acute stress.  Vascular  Dorsalis pedis and posterior tibial  pulses are palpable  bilaterally.  Capillary return is within normal limits  bilaterally. Temperature is within normal limits  bilaterally.  Neurologic  Senn-Weinstein monofilament wire test diminished   bilaterally. Muscle power within normal limits bilaterally.  Nails Thick disfigured discolored nails with subungual debris  hallux  bilaterally. No evidence of bacterial infection or drainage bilaterally.   Orthopedic  No limitations of motion of motion feet .  No crepitus or effusions noted.  No bony pathology or digital deformities noted. HAV  B/L.  Skin  normotropic skin with no porokeratosis noted bilaterally.  No signs of infections or ulcers noted.     Onychomycosis  Diabetes with neuropathy.  IE  Debride nails x 10.  A diabetic foot exam was performed and there is no evidence of any vascular  pathology..  Diminished LOPS  B/L.   RTC 3 months. Patient qualifies for diabetic shoes.  Helane Gunther DPM    Helane Gunther DPM

## 2024-02-01 ENCOUNTER — Ambulatory Visit: Payer: Medicare Other

## 2024-02-01 DIAGNOSIS — E1142 Type 2 diabetes mellitus with diabetic polyneuropathy: Secondary | ICD-10-CM

## 2024-02-01 DIAGNOSIS — M2010 Hallux valgus (acquired), unspecified foot: Secondary | ICD-10-CM

## 2024-02-01 DIAGNOSIS — M2142 Flat foot [pes planus] (acquired), left foot: Secondary | ICD-10-CM

## 2024-02-01 NOTE — Progress Notes (Signed)
 Patient presents to the office today for diabetic shoe and insole measuring.  Patient was measured with brannock device to determine size and width for 1 pair of extra depth shoes and foam casted for 3 pair of insoles.   Documentation of medical necessity will be sent to patient's treating diabetic doctor to verify and sign.   Patient's diabetic provider: Irena Reichmann MD   Shoes and insoles will be ordered at that time and patient will be notified for an appointment for fitting when they arrive.   Shoe size (per patient): 9WD Shoe choice:   [id number] / [id number] Shoe size ordered: 9WD  Ppw / ABN signed

## 2024-02-06 ENCOUNTER — Telehealth: Payer: Self-pay | Admitting: Adult Health

## 2024-02-06 NOTE — Telephone Encounter (Signed)
 PHONE ROOM: [facility] TO CONTACT DME

## 2024-02-06 NOTE — Telephone Encounter (Signed)
 Pt cancelled 3/25 visit due to conflict but she is on the to be seen once a year and 2024 she came March and July. Leaving appt for 7/29 on the schedule. Pt needs supplies - refill she needs Head Gear - [employer] usually calls but they haven't sent anything last year.

## 2024-02-07 ENCOUNTER — Telehealth: Payer: 59 | Admitting: Adult Health

## 2024-03-06 ENCOUNTER — Ambulatory Visit (INDEPENDENT_AMBULATORY_CARE_PROVIDER_SITE_OTHER)

## 2024-03-06 DIAGNOSIS — E1142 Type 2 diabetes mellitus with diabetic polyneuropathy: Secondary | ICD-10-CM | POA: Diagnosis not present

## 2024-03-06 DIAGNOSIS — M2142 Flat foot [pes planus] (acquired), left foot: Secondary | ICD-10-CM

## 2024-03-06 DIAGNOSIS — M2010 Hallux valgus (acquired), unspecified foot: Secondary | ICD-10-CM

## 2024-03-06 DIAGNOSIS — M2141 Flat foot [pes planus] (acquired), right foot: Secondary | ICD-10-CM | POA: Diagnosis not present

## 2024-03-06 NOTE — Progress Notes (Signed)
 Patient presents today to pick up diabetic shoes and insoles.  Patient was dispensed 1 pair of diabetic shoes and 3 pairs total contact diabetic insoles. Fit was satisfactory. Instructions for break-in and wear was reviewed and a copy was given to the patient.   Re-appointment for regularly scheduled diabetic foot care visits or if they should experience any trouble with the shoes or insoles.   Addison Bailey Cped, CFo, CFm

## 2024-03-29 ENCOUNTER — Ambulatory Visit: Payer: Medicare Other | Admitting: Podiatry

## 2024-04-10 ENCOUNTER — Other Ambulatory Visit: Payer: Self-pay | Admitting: Family Medicine

## 2024-04-10 DIAGNOSIS — M5412 Radiculopathy, cervical region: Secondary | ICD-10-CM

## 2024-04-10 DIAGNOSIS — R202 Paresthesia of skin: Secondary | ICD-10-CM

## 2024-04-19 ENCOUNTER — Ambulatory Visit
Admission: RE | Admit: 2024-04-19 | Discharge: 2024-04-19 | Disposition: A | Discharge status: Home / Self Care | Source: Ambulatory Visit | Attending: Family Medicine | Admitting: Family Medicine

## 2024-04-19 DIAGNOSIS — M5412 Radiculopathy, cervical region: Secondary | ICD-10-CM

## 2024-04-19 DIAGNOSIS — R200 Anesthesia of skin: Secondary | ICD-10-CM

## 2024-06-11 ENCOUNTER — Encounter: Payer: Self-pay | Admitting: *Deleted

## 2024-06-12 ENCOUNTER — Telehealth: Payer: Medicare Other | Admitting: Adult Health

## 2024-06-12 DIAGNOSIS — G4733 Obstructive sleep apnea (adult) (pediatric): Secondary | ICD-10-CM

## 2024-06-12 NOTE — Progress Notes (Signed)
 PATIENT: Rachel Hess DOB: 28-Oct-1951  REASON FOR VISIT: follow up HISTORY FROM: patient PRIMARY NEUROLOGIST: Dr. Buck   Virtual Visit via Video Note  I connected with Rachel Hess on 06/12/24 at  1:30 PM EDT by a video enabled telemedicine application located remotely at [facility] and verified that I am speaking with the correct person using two identifiers who was located at their own home in [place]    I discussed the limitations of evaluation and management by telemedicine and the availability of in person appointments. The patient expressed understanding and agreed to proceed.   PATIENT: Rachel Hess DOB: 03-Jul-1951  REASON FOR VISIT: follow up HISTORY FROM: patient  HISTORY OF PRESENT ILLNESS: Today 06/12/24   Rachel Hess is a 73 y.o. female with a history of OSA on CPAP. Returns today for follow-up.  She reports that CPAP is working well for her.  She denies any new issues.  She currently wears the nasal pillows.  She states that her insurance changed and getting supplies has become rather expensive.  I did advise that she could look online(reputable seller) to if she could get it cheaper.  She returns today for an evaluation.       REVIEW OF SYSTEMS: Out of a complete 14 system review of symptoms, the patient complains only of the following symptoms, and all other reviewed systems are negative.  ALLERGIES: No Known Allergies  HOME MEDICATIONS: Outpatient Medications Prior to Visit  Medication Sig Dispense Refill   acetaminophen  (TYLENOL  8 HOUR ARTHRITIS PAIN) 650 MG CR tablet Take 650 mg by mouth every 8 (eight) hours as needed for pain.     fexofenadine-pseudoephedrine (ALLEGRA-D 24) 180-240 MG per 24 hr tablet Take 1 tablet by mouth daily as needed.      glipiZIDE (GLUCOTROL XL) 10 MG 24 hr tablet Take 10 mg by mouth 2 (two) times daily.     hydrochlorothiazide (HYDRODIURIL) 25 MG  tablet Take 25 mg by mouth daily.      ibuprofen (ADVIL,MOTRIN) 800 MG tablet Take 800 mg by mouth every 8 (eight) hours as needed for mild pain or moderate pain.      tirzepatide (MOUNJARO) 10 MG/0.5ML Pen Inject 2.5 mg as directed once a week.     No facility-administered medications prior to visit.    PAST MEDICAL HISTORY: Past Medical History:  Diagnosis Date   Allergy    sinuses   Constipation    Fibromyalgia    Goiter    Hyperlipemia    Morbid obesity (HCC)    OSA on CPAP    Seasonal allergies    Vitamin D deficiency     PAST SURGICAL HISTORY: Past Surgical History:  Procedure Laterality Date   CARPAL TUNNEL RELEASE Left    cataract     surgery both eyes   CHOLECYSTECTOMY OPEN     COLONOSCOPY W/ POLYPECTOMY     THYROID  LOBECTOMY Left 10/17/2015   THYROIDECTOMY Left 10/17/2015   Procedure: LEFT THYROIDECTOMY ;  Surgeon: Alm Bouche, MD;  Location: [facility] OR;  Service: ENT;  Laterality: Left;   TONSILLECTOMY     TUBAL LIGATION     VAGINAL HYSTERECTOMY      FAMILY HISTORY: Family History  Problem Relation Age of Onset   Heart disease Mother    Cancer Father    Sleep apnea Neg Hx     SOCIAL HISTORY: Social History   Socioeconomic History   Marital status: Divorced  Spouse name: [place]   Number of children: 1   Years of education: 12   Highest education level: Not on file  Occupational History   Not on file  Tobacco Use   Smoking status: Former    Current packs/day: 0.50    Average packs/day: 0.5 packs/day for 25.0 years (12.5 ttl pk-yrs)    Types: Cigarettes   Smokeless tobacco: Never   Tobacco comments:    Quit smoking cigarettes in the 1990  Vaping Use   Vaping status: Never Used  Substance and Sexual Activity   Alcohol use: Yes    Alcohol/week: 0.0 standard drinks of alcohol    Comment: 10/17/2015 might have a couple drinks/month   Drug use: No   Sexual activity: Never    Partners: Male  Other Topics Concern   Not on file  Social  History Narrative   Drinks about 1 cup of coffee a day    Social Drivers of Corporate investment banker Strain: Not on file  Food Insecurity: Not on file  Transportation Needs: Not on file  Physical Activity: Not on file  Stress: Not on file  Social Connections: Not on file  Intimate Partner Violence: Not At Risk (08/17/2022)   Received from [facility]    Abuse Screen    Feels Unsafe at Home or Work/School: no    Feels Threatened by Someone: no    Does Anyone Try to Keep You From Having Contact with Others or Doing Things Outside Your Home?: no    Physical Signs of Abuse Present: no      PHYSICAL EXAM Generalized: Well developed, in no acute distress   Neurological examination  Mentation: Alert oriented to time, place, history taking. Follows all commands speech and language fluent Cranial nerve II-XII: Facial symmetry noted.   DIAGNOSTIC DATA (LABS, IMAGING, TESTING) - I reviewed patient records, labs, notes, testing and imaging myself where available.  Lab Results  Component Value Date   WBC 14.2 (H) 10/17/2015   HGB 12.1 10/17/2015   HCT 36.8 10/17/2015   MCV 89.8 10/17/2015   PLT 259 10/17/2015      Component Value Date/Time   NA 141 10/13/2015 1037   K 4.1 10/13/2015 1037   CL 108 10/13/2015 1037   CO2 26 10/13/2015 1037   GLUCOSE 108 (H) 10/13/2015 1037   BUN 11 10/13/2015 1037   CREATININE 0.71 10/17/2015 1434   CREATININE 0.71 03/12/2014 1421   CALCIUM 9.5 10/13/2015 1037   PROT 6.9 03/12/2014 1421   ALBUMIN 4.0 03/12/2014 1421   AST 21 03/12/2014 1421   ALT 24 03/12/2014 1421   ALKPHOS 91 03/12/2014 1421   BILITOT 0.3 03/12/2014 1421   GFRNONAA >60 10/17/2015 1434   GFRNONAA 83 12/19/2013 0845   GFRAA >60 10/17/2015 1434   GFRAA >89 12/19/2013 0845   No results found for: CHOL, HDL, LDLCALC, LDLDIRECT, TRIG, CHOLHDL Lab Results  Component Value Date   HGBA1C 6.6 (H) 12/19/2013     ASSESSMENT AND PLAN 73  y.o. year old female  has a past medical history of Allergy, Constipation, Fibromyalgia, Goiter, Hyperlipemia, Morbid obesity (HCC), OSA on CPAP, Seasonal allergies, and Vitamin D deficiency. here with:  OSA on CPAP  CPAP compliance excellent Residual AHI is good Encouraged patient to continue using CPAP nightly and > 4 hours each night F/U in 1 year or sooner if needed     Duwaine Russell, MSN, NP-C 06/12/2024, 1:24 PM [facility] [address],  [address] [place], [place] [address] [phone]  The patient's condition requires frequent monitoring and adjustments in the treatment plan, reflecting the ongoing complexity of care.  This provider is the continuing focal point for all needed services for this condition.

## 2024-06-18 ENCOUNTER — Encounter: Payer: Self-pay | Admitting: Dermatology

## 2024-06-18 ENCOUNTER — Ambulatory Visit: Admitting: Dermatology

## 2024-06-18 VITALS — BP 144/69 | HR 89

## 2024-06-18 DIAGNOSIS — L918 Other hypertrophic disorders of the skin: Secondary | ICD-10-CM | POA: Diagnosis not present

## 2024-06-18 DIAGNOSIS — D492 Neoplasm of unspecified behavior of bone, soft tissue, and skin: Secondary | ICD-10-CM | POA: Diagnosis not present

## 2024-06-18 DIAGNOSIS — K648 Other hemorrhoids: Secondary | ICD-10-CM | POA: Insufficient documentation

## 2024-06-18 DIAGNOSIS — K5730 Diverticulosis of large intestine without perforation or abscess without bleeding: Secondary | ICD-10-CM | POA: Insufficient documentation

## 2024-06-18 DIAGNOSIS — B079 Viral wart, unspecified: Secondary | ICD-10-CM | POA: Diagnosis not present

## 2024-06-18 DIAGNOSIS — Z1211 Encounter for screening for malignant neoplasm of colon: Secondary | ICD-10-CM | POA: Insufficient documentation

## 2024-06-18 DIAGNOSIS — D485 Neoplasm of uncertain behavior of skin: Secondary | ICD-10-CM

## 2024-06-18 NOTE — Progress Notes (Signed)
   New Patient Visit   Subjective  Rachel Hess is a 73 y.o. female who presents for the following: Spot on Left Buttock  Patient states she has spot located at the Left Buttock that she would like to have examined. Patient reports the areas have been there for 6 months. She reports the areas are not bothersome. Patient reports the area will bleed and can be painful. Patient rates irritation 10 out of 10.  Patient reports she has not previously been treated for these areas. Patient denies Hx of bx. Patent denies hx of Skin Cancer. Patient denies family history of skin cancer(s).  The patient has spots, moles and lesions to be evaluated, some may be new or changing.  The following portions of the chart were reviewed this encounter and updated as appropriate: medications, allergies, medical history  Review of Systems:  No other skin or systemic complaints except as noted in HPI or Assessment and Plan.  Objective  Well appearing patient in no apparent distress; mood and affect are within normal limits.   A focused examination was performed of the following areas: Left Buttock Neck  Relevant exam findings are noted in the Assessment and Plan.            Left Buttock 7 mm Verrucous Papule   Assessment & Plan   NEOPLASM OF UNCERTAIN BEHAVIOR OF SKIN Left Buttock Epidermal / dermal shaving  Lesion diameter (cm):  0.7 Informed consent: discussed and consent obtained   Timeout: patient name, date of birth, surgical site, and procedure verified   Procedure prep:  Patient was prepped and draped in usual sterile fashion Prep type:  Isopropyl alcohol Anesthesia: the lesion was anesthetized in a standard fashion   Anesthetic:  1% lidocaine  w/ epinephrine  1-100,000 buffered w/ 8.4% NaHCO3 Instrument used: DermaBlade   Hemostasis achieved with: pressure and aluminum chloride   Outcome: patient tolerated procedure well   Post-procedure details: sterile dressing applied    Dressing type: petrolatum  and bandage    Specimen A - Surgical pathology Differential Diagnosis: R/O Wart vs other  Check Margins: No SKIN TAGS, MULTIPLE ACQUIRED    Acrochordons (Skin Tags)- neck - Fleshy, skin-colored pedunculated papules - Benign appearing.  - Observe. - If desired, they can be removed with an in office procedure that is not covered by insurance. - Please call the clinic if you notice any new or changing lesions.  Return for based on biopsy results.  I, Jetta Ager, am acting as scribe for RUFUS CHRISTELLA HOLY, MD.  Documentation: I have reviewed the above documentation for accuracy and completeness, and I agree with the above.  RUFUS CHRISTELLA HOLY, MD

## 2024-06-18 NOTE — Patient Instructions (Addendum)

## 2024-06-21 LAB — SURGICAL PATHOLOGY

## 2024-06-25 ENCOUNTER — Ambulatory Visit: Payer: Self-pay | Admitting: Dermatology

## 2024-07-18 ENCOUNTER — Other Ambulatory Visit: Payer: Self-pay | Admitting: Family Medicine

## 2024-07-18 DIAGNOSIS — Z1231 Encounter for screening mammogram for malignant neoplasm of breast: Secondary | ICD-10-CM

## 2024-08-01 ENCOUNTER — Ambulatory Visit
Admission: RE | Admit: 2024-08-01 | Discharge: 2024-08-01 | Disposition: A | Discharge status: Home / Self Care | Source: Ambulatory Visit | Attending: Family Medicine | Admitting: Family Medicine

## 2024-08-01 DIAGNOSIS — Z1231 Encounter for screening mammogram for malignant neoplasm of breast: Secondary | ICD-10-CM

## 2024-08-09 ENCOUNTER — Ambulatory Visit
Admission: RE | Admit: 2024-08-09 | Discharge: 2024-08-09 | Disposition: A | Discharge status: Home / Self Care | Source: Ambulatory Visit | Attending: Family Medicine | Admitting: Family Medicine

## 2024-08-14 ENCOUNTER — Ambulatory Visit

## 2025-06-11 ENCOUNTER — Telehealth: Admitting: Adult Health
